# Patient Record
Sex: Female | Born: 1975 | Race: Black or African American | Hispanic: No | Marital: Single | Smoking: Never smoker
Health system: Southern US, Community
[De-identification: ages and names within clinical notes are randomized; demographics above are authoritative.]

## PROBLEM LIST (undated history)

## (undated) DIAGNOSIS — K439 Ventral hernia without obstruction or gangrene: Secondary | ICD-10-CM

## (undated) DIAGNOSIS — R1905 Periumbilic swelling, mass or lump: Secondary | ICD-10-CM

## (undated) HISTORY — PX: TUBAL LIGATION: SHX77

---

## 1998-09-12 ENCOUNTER — Emergency Department (HOSPITAL_COMMUNITY): Admission: EM | Admit: 1998-09-12 | Discharge: 1998-09-12 | Payer: Self-pay | Admitting: Emergency Medicine

## 1999-10-07 ENCOUNTER — Emergency Department (HOSPITAL_COMMUNITY): Admission: EM | Admit: 1999-10-07 | Discharge: 1999-10-07 | Payer: Self-pay | Admitting: Emergency Medicine

## 1999-10-09 ENCOUNTER — Emergency Department (HOSPITAL_COMMUNITY): Admission: EM | Admit: 1999-10-09 | Discharge: 1999-10-09 | Payer: Self-pay | Admitting: Emergency Medicine

## 1999-12-03 ENCOUNTER — Encounter: Payer: Self-pay | Admitting: Emergency Medicine

## 1999-12-03 ENCOUNTER — Emergency Department (HOSPITAL_COMMUNITY): Admission: EM | Admit: 1999-12-03 | Discharge: 1999-12-03 | Payer: Self-pay | Admitting: Emergency Medicine

## 1999-12-20 ENCOUNTER — Emergency Department (HOSPITAL_COMMUNITY): Admission: EM | Admit: 1999-12-20 | Discharge: 1999-12-20 | Payer: Self-pay | Admitting: Emergency Medicine

## 1999-12-29 ENCOUNTER — Encounter: Admission: RE | Admit: 1999-12-29 | Discharge: 1999-12-29 | Payer: Self-pay | Admitting: Internal Medicine

## 2000-02-29 ENCOUNTER — Encounter: Payer: Self-pay | Admitting: Emergency Medicine

## 2000-02-29 ENCOUNTER — Emergency Department (HOSPITAL_COMMUNITY): Admission: EM | Admit: 2000-02-29 | Discharge: 2000-02-29 | Payer: Self-pay | Admitting: Emergency Medicine

## 2000-03-21 ENCOUNTER — Emergency Department (HOSPITAL_COMMUNITY): Admission: EM | Admit: 2000-03-21 | Discharge: 2000-03-21 | Payer: Self-pay | Admitting: Emergency Medicine

## 2000-05-30 ENCOUNTER — Emergency Department (HOSPITAL_COMMUNITY): Admission: EM | Admit: 2000-05-30 | Discharge: 2000-05-30 | Payer: Self-pay | Admitting: *Deleted

## 2000-07-02 ENCOUNTER — Emergency Department (HOSPITAL_COMMUNITY): Admission: EM | Admit: 2000-07-02 | Discharge: 2000-07-02 | Payer: Self-pay | Admitting: Internal Medicine

## 2000-07-02 ENCOUNTER — Encounter: Payer: Self-pay | Admitting: Internal Medicine

## 2000-07-07 ENCOUNTER — Emergency Department (HOSPITAL_COMMUNITY): Admission: EM | Admit: 2000-07-07 | Discharge: 2000-07-07 | Payer: Self-pay | Admitting: Emergency Medicine

## 2000-09-08 ENCOUNTER — Inpatient Hospital Stay (HOSPITAL_COMMUNITY): Admission: EM | Admit: 2000-09-08 | Discharge: 2000-09-12 | Payer: Self-pay | Admitting: Emergency Medicine

## 2009-09-18 ENCOUNTER — Emergency Department (HOSPITAL_COMMUNITY): Admission: EM | Admit: 2009-09-18 | Discharge: 2009-09-18 | Payer: Self-pay | Admitting: Emergency Medicine

## 2009-10-21 ENCOUNTER — Emergency Department (HOSPITAL_COMMUNITY): Admission: EM | Admit: 2009-10-21 | Discharge: 2009-10-21 | Payer: Self-pay | Admitting: Emergency Medicine

## 2009-12-15 ENCOUNTER — Encounter: Admission: RE | Admit: 2009-12-15 | Discharge: 2009-12-15 | Payer: Self-pay | Admitting: Neurology

## 2010-11-07 ENCOUNTER — Encounter: Payer: Self-pay | Admitting: Neurology

## 2010-12-27 ENCOUNTER — Emergency Department (HOSPITAL_COMMUNITY)
Admission: EM | Admit: 2010-12-27 | Discharge: 2010-12-27 | Disposition: A | Discharge status: Home / Self Care | Payer: No Typology Code available for payment source | Attending: Emergency Medicine | Admitting: Emergency Medicine

## 2010-12-27 ENCOUNTER — Emergency Department (HOSPITAL_COMMUNITY): Payer: No Typology Code available for payment source

## 2010-12-27 DIAGNOSIS — R51 Headache: Secondary | ICD-10-CM | POA: Insufficient documentation

## 2010-12-27 DIAGNOSIS — S139XXA Sprain of joints and ligaments of unspecified parts of neck, initial encounter: Secondary | ICD-10-CM | POA: Insufficient documentation

## 2010-12-27 DIAGNOSIS — M542 Cervicalgia: Secondary | ICD-10-CM | POA: Insufficient documentation

## 2011-03-04 NOTE — H&P (Signed)
[place]. [facility]  Patient:    Stacie Stone, Stacie Stone                       MRN: [id number] Adm. Date:  [id number] Disc. Date: [id number] Attending:  Annamarie Dawley                         History and Physical  DATE OF BIRTH:  [birth year]  CHIEF COMPLAINT:  Overdose.  HISTORY OF PRESENT ILLNESS:  Stacie Stone is a 35 year old black female, upset after finding out that her boyfriend had gotten a "minor" pregnant.  She took an overdose of an unknown quantity of Tylenol, questionable one bottle and unknown quantity of aspirin, question one to two bottles of 100 count.  She was [place] after wandering around during the day and was conscious and transferred to the [facility] Emergency Room via EMS.  The patient now states she was not trying to kill herself but her aunt and ER records indicate that it was a suicide attempt.  PAST MEDICAL HISTORY:  Positive for asthma, childbirth x 2.  No surgeries.  ALLERGIES:  NKDA.  MEDICATIONS:  Albuterol MDI p.r.n.  SOCIAL HISTORY:  The patient works, is a single mom of two children, 77 and 57 years old.  No tobacco, alcohol or drugs per history.  FAMILY HISTORY:  Negative for cancer, CAD, hypertension, or diabetes. Positive for asthma.  REVIEW OF SYSTEMS:  No headache or visual changes.  No chest pain or shortness of breath.  No fever.  No dysuria or frequency.  No complaints of abdominal pain or extremity pain.  Positive for nausea.  No BRBPR or hematemesis.  PHYSICAL EXAMINATION:  VITAL SIGNS:  Temperature 97.1, blood pressure 116/78, pulse 56, respirations 18.  GENERAL:  Drowsy.  SKIN:  Warm and dry.  HEENT:  TMs are clear.  EOMI.  PERRL, 4 to 3 mm bilaterally.  No nystagmus. Fundi not visualized.  Oropharynx:  Good dentition.  Does have charcoal staining of her tongue.  NECK:  Supple.  No TMR, adenopathy.  LUNGS:  Clear to auscultation bilaterally.  No wheezes.  No crackles. Good air  movement.  HEART:  RRR, S1, S2.  No MHR.  ABDOMEN:  Positive bowel sounds.  NTND.  No HSM or masses.  GU/RECTAL:  Deferred.  EXTREMITIES:  Pulses 1+.  No CCE.  NEUROLOGICAL:  Oriented x 4.  Cranial nerves II-XII intact, somewhat slurred speech, 5/5 motor.  Sensory is intact bilaterally to fine touch, 3+ biceps and knee DTRs.  No clonus.  Downgoing toes.  LABORATORY DATA:  Silicate level 40.8, acetaminophen level 138.4.  Urine pregnancy test negative.  Urine pH 5.5 with specific gravity 1.028.  CBC: Hemoglobin 12.0, hematocrit 35.9, WBC 6.9, platelets 319.  Sodium 138, potassium 3.2, chloride 102, bicarb 24, BUN 19, creatinine 0.9, glucose 101, calcium 9.9, albumin 3.9.  AST 21, ALT 17, alkaline phosphatase 61.  Total bilirubin 0.8.  Drug screen positive for tetracannabinol.  ECG shows rate of 61, sinus arrhythmia.  ASSESSMENT AND PLAN:  Moderate to severe overdose/suicide attempt with aspirin and acetaminophen.  Alkaline diuresis for aspirin toxicity, Mucomyst for Tylenol.  ICU bed with close monitoring of vital signs.  Electrolytes, drug levels, and serum and urine pH.  I have asked for a psychiatric consult for tomorrow. DD:  09/09/00 TD:  09/09/00 Job: [id number]

## 2011-03-04 NOTE — Discharge Summary (Signed)
[place]. [facility]  Patient:    Stacie Stone, Stacie Stone                       MRN: [id number] Adm. Date:  [id number] Disc. Date: [id number] Attending:  Marily Memos CC:         [facility]   Discharge Summary  DATE OF BIRTH:  [birth year]  ADMISSION DIAGNOSIS:  Tylenol and aspirin overdose/suicide attempt.  DISCHARGE DIAGNOSIS:  Tylenol and aspirin overdose/suicide attempt.  CONSULTATIONS:  Psychiatry.  PROCEDURES:  EKG showing normal sinus rhythm, ventricular rate of 61.  ADMISSION HISTORY:  Ms. Lobue was initially admitted after being upset over a boyfriend getting a minor pregnant.  This was a previous friend of hers, and she when she found out she took Tylenol and aspirin.  At first did not know the quantity.  Later when she was more arousable, she said they were small bottles.  She was found at a shopping center after she had been wandering around for a while.  She was conscious at the time time and was transported to the emergency room via EMS.  HOSPITAL COURSE:  On admission she was quite lethargic.  She was given charcoal.  She also received an alkaline diuresis for the aspirin overdose, and blood gases did normalize.  She received a full course of Mucomyst for the Tylenol overdose.  She was placed in the intensive care unit for close monitoring.  Her vital signs remained stable throughout her hospital course. The morning after overdose she was lucid and conversant and said she was not trying to kill herself.  Prior to discharge she was seen by psychiatry.  They recommended outpatient follow-up and discharge to home.  LABORATORY AND X-RAY DATA:  Drug screen positive for cannabinoids.  Urine initially showed ketones which later resolved.  Initial pH 5.5, with alkalization went up to 8.0.  One urine did show positive LE.  Urine culture was negative.  Initial acetaminophen level 138.4.  Initial salicylate level  of 40.8.  Electrolytes:  Sodium 135, potassium 4.1, chloride 108, bicarb 20, glucose 73, BUN 7, creatinine 0.7, calcium 8.7, protein 6.3, albumin 2.8. LFTs normal.  Magnesium 1.9, phosphorus 3.5.  PT, INR, and PTT normal, and previous electrolytes from November 25.  She initially had a potassium of 3.2 and total protein of 9.3 - all others were within normal limits.  DISCHARGE FOLLOWUP:  The patient is to follow up with [facility] and to call our office with any questions at [phone].  CONDITION ON DISCHARGE:  Much improved. DD:  10/04/00 TD:  10/05/00 Job: [id number]

## 2011-04-10 ENCOUNTER — Emergency Department (HOSPITAL_COMMUNITY)
Admission: EM | Admit: 2011-04-10 | Discharge: 2011-04-10 | Disposition: A | Discharge status: Home / Self Care | Payer: Self-pay | Attending: Emergency Medicine | Admitting: Emergency Medicine

## 2011-04-10 ENCOUNTER — Emergency Department (HOSPITAL_COMMUNITY): Payer: Self-pay

## 2011-04-10 DIAGNOSIS — M79609 Pain in unspecified limb: Secondary | ICD-10-CM | POA: Insufficient documentation

## 2011-04-10 DIAGNOSIS — X500XXA Overexertion from strenuous movement or load, initial encounter: Secondary | ICD-10-CM | POA: Insufficient documentation

## 2011-04-10 DIAGNOSIS — S93609A Unspecified sprain of unspecified foot, initial encounter: Secondary | ICD-10-CM | POA: Insufficient documentation

## 2011-08-17 ENCOUNTER — Emergency Department (HOSPITAL_COMMUNITY)
Admission: EM | Admit: 2011-08-17 | Discharge: 2011-08-17 | Disposition: A | Discharge status: Home / Self Care | Payer: Self-pay | Attending: Emergency Medicine | Admitting: Emergency Medicine

## 2011-08-17 ENCOUNTER — Emergency Department (HOSPITAL_COMMUNITY): Payer: Self-pay

## 2011-08-17 DIAGNOSIS — L299 Pruritus, unspecified: Secondary | ICD-10-CM | POA: Insufficient documentation

## 2011-08-17 DIAGNOSIS — M549 Dorsalgia, unspecified: Secondary | ICD-10-CM | POA: Insufficient documentation

## 2011-08-17 DIAGNOSIS — R109 Unspecified abdominal pain: Secondary | ICD-10-CM | POA: Insufficient documentation

## 2011-08-17 DIAGNOSIS — L989 Disorder of the skin and subcutaneous tissue, unspecified: Secondary | ICD-10-CM | POA: Insufficient documentation

## 2011-08-17 DIAGNOSIS — R21 Rash and other nonspecific skin eruption: Secondary | ICD-10-CM | POA: Insufficient documentation

## 2011-08-17 LAB — COMPREHENSIVE METABOLIC PANEL
ALT: 12 U/L (ref 0–35)
AST: 12 U/L (ref 0–37)
Alkaline Phosphatase: 75 U/L (ref 39–117)
CO2: 25 mEq/L (ref 19–32)
Calcium: 9.6 mg/dL (ref 8.4–10.5)
GFR calc Af Amer: >90 mL/min (ref >90)
GFR calc non Af Amer: >90 mL/min (ref >90)
Glucose, Bld: 74 mg/dL (ref 70–99)
Potassium: 3.2 mEq/L — ABNORMAL LOW (ref 3.5–5.1)
Sodium: 139 mEq/L (ref 135–145)
Total Protein: 7.9 g/dL (ref 6.0–8.3)

## 2011-08-17 LAB — DIFFERENTIAL
Basophils Absolute: 0 10*3/uL (ref 0.0–0.1)
Basophils Relative: 0 % (ref 0–1)
Eosinophils Absolute: 0 10*3/uL (ref 0.0–0.7)
Eosinophils Relative: 0 % (ref 0–5)
Lymphocytes Relative: 29 % (ref 12–46)
Lymphs Abs: 2.3 K/uL (ref 0.7–4.0)
Monocytes Absolute: 0.4 K/uL (ref 0.1–1.0)
Monocytes Relative: 5 % (ref 3–12)
Neutro Abs: 5.3 10*3/uL (ref 1.7–7.7)
Neutrophils Relative %: 66 % (ref 43–77)

## 2011-08-17 LAB — URINALYSIS, ROUTINE W REFLEX MICROSCOPIC
Glucose, UA: NEGATIVE mg/dL
Ketones, ur: 15 mg/dL — AB
Nitrite: NEGATIVE
Protein, ur: NEGATIVE mg/dL
Specific Gravity, Urine: 1.025 (ref 1.005–1.030)
Urobilinogen, UA: 1 mg/dL (ref 0.0–1.0)
pH: 5.5 (ref 5.0–8.0)

## 2011-08-17 LAB — COMPREHENSIVE METABOLIC PANEL WITH GFR
Albumin: 3.6 g/dL (ref 3.5–5.2)
BUN: 10 mg/dL (ref 6–23)
Chloride: 102 meq/L (ref 96–112)
Creatinine, Ser: 0.64 mg/dL (ref 0.50–1.10)
Total Bilirubin: 0.4 mg/dL (ref 0.3–1.2)

## 2011-08-17 LAB — CBC
HCT: 35.7 % — ABNORMAL LOW (ref 36.0–46.0)
Hemoglobin: 11.6 g/dL — ABNORMAL LOW (ref 12.0–15.0)
MCH: 25.6 pg — ABNORMAL LOW (ref 26.0–34.0)
MCHC: 32.5 g/dL (ref 30.0–36.0)
MCV: 78.8 fL (ref 78.0–100.0)
Platelets: 323 10*3/uL (ref 150–400)
RBC: 4.53 MIL/uL (ref 3.87–5.11)
RDW: 15.3 % (ref 11.5–15.5)
WBC: 8 K/uL (ref 4.0–10.5)

## 2011-08-17 LAB — URINE MICROSCOPIC-ADD ON

## 2011-08-17 LAB — LIPASE, BLOOD: Lipase: 20 U/L (ref 11–59)

## 2011-08-17 LAB — PREGNANCY, URINE: Preg Test, Ur: NEGATIVE

## 2011-08-19 LAB — URINE CULTURE
Colony Count: >=100000
Culture  Setup Time: 201210311636

## 2012-12-18 ENCOUNTER — Emergency Department (HOSPITAL_COMMUNITY)
Admission: EM | Admit: 2012-12-18 | Discharge: 2012-12-18 | Disposition: A | Discharge status: Home / Self Care | Payer: Self-pay | Attending: Emergency Medicine | Admitting: Emergency Medicine

## 2012-12-18 ENCOUNTER — Emergency Department (HOSPITAL_COMMUNITY): Payer: Self-pay

## 2012-12-18 ENCOUNTER — Encounter (HOSPITAL_COMMUNITY): Payer: Self-pay | Admitting: *Deleted

## 2012-12-18 DIAGNOSIS — M25539 Pain in unspecified wrist: Secondary | ICD-10-CM | POA: Insufficient documentation

## 2012-12-18 DIAGNOSIS — F17200 Nicotine dependence, unspecified, uncomplicated: Secondary | ICD-10-CM | POA: Insufficient documentation

## 2012-12-18 MED ORDER — IBUPROFEN 200 MG PO TABS
600.0000 mg | ORAL_TABLET | Freq: Once | ORAL | Status: AC
  Administered 2012-12-18: 600 mg via ORAL
  Filled 2012-12-18: qty 3

## 2012-12-18 MED ORDER — IBUPROFEN 600 MG PO TABS: 600.0000 mg | ORAL_TABLET | Freq: Three times a day (TID) | ORAL | Status: DC | PRN

## 2012-12-18 NOTE — ED Notes (Signed)
Pt reports R wrist pain x2 months. Worse with movement. Felt a "pop" a few days ago and it has been worse since then. Pt has a brace on R wrist currently. Denies previous injury.

## 2012-12-18 NOTE — ED Notes (Signed)
Pt alert and oriented x4. Respirations even and unlabored, bilateral symmetrical rise and fall of chest. Skin warm and dry. In no acute distress. Denies needs.   

## 2012-12-18 NOTE — ED Notes (Signed)
Pt to xray

## 2012-12-18 NOTE — ED Notes (Signed)
Pt escorted to discharge window. Pt verbalized understanding discharge instructions. In no acute distress.  

## 2012-12-18 NOTE — ED Notes (Signed)
md at bedside

## 2012-12-18 NOTE — ED Provider Notes (Signed)
History     CSN: [id number]  Arrival date & time 12/18/12  0904   First MD Initiated Contact with Patient 12/18/12 [phone]      Chief Complaint  Patient presents with  . Wrist Pain    (Consider location/radiation/quality/duration/timing/severity/associated sxs/prior treatment) Patient is a 37 y.o. female presenting with wrist pain. The history is provided by the patient.  Wrist Pain  pt c/o right wrist pain for the past 1-2 months. Constant. Dull. Moderate. Worse w palpation and certain movements. Does not recall a specific injury. No redness or skin changes. No numbness/weakness. Pain does not radiate. Current not working. Denies any repetitive use of right hand or wrist. No hx carpal tunnel. Is right hand dominant. No fever/chills.     History reviewed. No pertinent past medical history.  Past Surgical History  Procedure Laterality Date  . Tubal ligation      History reviewed. No pertinent family history.  History  Substance Use Topics  . Smoking status: Current Every Day Smoker  . Smokeless tobacco: Not on file  . Alcohol Use: Yes     Comment: occasionally    OB History   Grav Para Term Preterm Abortions TAB SAB Ect Mult Living                  Review of Systems  Constitutional: Negative for fever.  HENT: Negative for neck pain.   Skin: Negative for rash and wound.  Neurological: Negative for weakness and numbness.    Allergies  Review of patient's allergies indicates no known allergies.  Home Medications  No current outpatient prescriptions on file.  BP 139/77  Pulse 55  Temp(Src) 98.3 F (36.8 C) (Oral)  Resp 14  SpO2 100%  LMP 12/09/2012  Physical Exam  Nursing note and vitals reviewed. Constitutional: She appears well-developed and well-nourished. No distress.  Eyes: Conjunctivae are normal. No scleral icterus.  Neck: Neck supple. No tracheal deviation present.  Cardiovascular: Normal rate.   Pulmonary/Chest: Effort normal. No respiratory  distress.  Abdominal: Normal appearance.  Musculoskeletal: Normal range of motion. She exhibits no edema.  Tenderness right wrist diffusely, no focal scaphoid tenderness. Radial pulse 2+, normal cap refill in digits.   Neurological: She is alert.  R/U/M n fxn intact.   Skin: Skin is warm and dry. No rash noted.  Psychiatric: She has a normal mood and affect.    ED Course  Procedures (including critical care time) Dg Wrist Complete Right  12/18/2012  *RADIOLOGY REPORT*  Clinical Data: Pain.  No known injury.  RIGHT WRIST - COMPLETE 3+ VIEW  Comparison: None.  Findings: No fracture or dislocation.  No significant degenerative changes.  IMPRESSION: Negative plain film exam of the right wrist.   Original Report Authenticated By: Lacy Duverney, M.D.        MDM  Xrays. Motrin po.  Pt has wrist splint with her.  Discussed diff dx incl arthritis/djd, tendonitis, ligament injury/strain, cts.   Will rx splint, motrin. Discussed pcp f/u if symptoms fail to improve/resolve.        Suzi Roots, MD 12/19/12 [phone]

## 2013-01-15 ENCOUNTER — Encounter (HOSPITAL_COMMUNITY): Payer: Self-pay | Admitting: *Deleted

## 2013-01-15 ENCOUNTER — Emergency Department (HOSPITAL_COMMUNITY): Payer: Medicaid Other

## 2013-01-15 ENCOUNTER — Inpatient Hospital Stay (HOSPITAL_COMMUNITY)
Admission: EM | Admit: 2013-01-15 | Discharge: 2013-01-17 | DRG: 153 | Disposition: A | Discharge status: Home / Self Care | Payer: Medicaid Other | Attending: Internal Medicine | Admitting: Internal Medicine

## 2013-01-15 DIAGNOSIS — J039 Acute tonsillitis, unspecified: Principal | ICD-10-CM

## 2013-01-15 DIAGNOSIS — D72829 Elevated white blood cell count, unspecified: Secondary | ICD-10-CM | POA: Diagnosis present

## 2013-01-15 DIAGNOSIS — E876 Hypokalemia: Secondary | ICD-10-CM

## 2013-01-15 DIAGNOSIS — Z23 Encounter for immunization: Secondary | ICD-10-CM

## 2013-01-15 DIAGNOSIS — F17200 Nicotine dependence, unspecified, uncomplicated: Secondary | ICD-10-CM | POA: Diagnosis present

## 2013-01-15 LAB — CBC WITH DIFFERENTIAL/PLATELET
Basophils Absolute: 0 10*3/uL (ref 0.0–0.1)
Basophils Relative: 0 % (ref 0–1)
Eosinophils Absolute: 0 10*3/uL (ref 0.0–0.7)
Hemoglobin: 12.5 g/dL (ref 12.0–15.0)
MCH: 26.3 pg (ref 26.0–34.0)
MCHC: 34.4 g/dL (ref 30.0–36.0)
Neutro Abs: 13.5 10*3/uL — ABNORMAL HIGH (ref 1.7–7.7)
Neutrophils Relative %: 79 % — ABNORMAL HIGH (ref 43–77)
Platelets: 237 10*3/uL (ref 150–400)

## 2013-01-15 LAB — HCG, SERUM, QUALITATIVE: Preg, Serum: NEGATIVE

## 2013-01-15 LAB — POCT I-STAT, CHEM 8
Creatinine, Ser: 0.7 mg/dL (ref 0.50–1.10)
HCT: 39 % (ref 36.0–46.0)
Hemoglobin: 13.3 g/dL (ref 12.0–15.0)
Potassium: 2.8 mEq/L — ABNORMAL LOW (ref 3.5–5.1)
Sodium: 141 mEq/L (ref 135–145)
TCO2: 26 mmol/L (ref 0–100)

## 2013-01-15 LAB — RAPID STREP SCREEN (MED CTR MEBANE ONLY): Streptococcus, Group A Screen (Direct): NEGATIVE

## 2013-01-15 MED ORDER — SODIUM CHLORIDE 0.9 % IV BOLUS (SEPSIS)
1000.0000 mL | Freq: Once | INTRAVENOUS | Status: AC
  Administered 2013-01-15: 1000 mL via INTRAVENOUS

## 2013-01-15 MED ORDER — POTASSIUM CHLORIDE CRYS ER 20 MEQ PO TBCR
40.0000 meq | EXTENDED_RELEASE_TABLET | Freq: Once | ORAL | Status: AC
  Administered 2013-01-15: 40 meq via ORAL
  Filled 2013-01-15: qty 2

## 2013-01-15 MED ORDER — SODIUM CHLORIDE 0.9 % IJ SOLN
3.0000 mL | Freq: Two times a day (BID) | INTRAMUSCULAR | Status: DC
  Administered 2013-01-16 (×2): 3 mL via INTRAVENOUS

## 2013-01-15 MED ORDER — ACETAMINOPHEN 650 MG RE SUPP: 650.0000 mg | Freq: Four times a day (QID) | RECTAL | Status: DC | PRN

## 2013-01-15 MED ORDER — POTASSIUM CHLORIDE IN NACL 20-0.9 MEQ/L-% IV SOLN
INTRAVENOUS | Status: AC
  Administered 2013-01-16 (×3): via INTRAVENOUS
  Filled 2013-01-15 (×4): qty 1000

## 2013-01-15 MED ORDER — MORPHINE SULFATE 4 MG/ML IJ SOLN
4.0000 mg | Freq: Once | INTRAMUSCULAR | Status: AC
  Administered 2013-01-15: 4 mg via INTRAVENOUS
  Filled 2013-01-15: qty 1

## 2013-01-15 MED ORDER — ONDANSETRON HCL 4 MG PO TABS: 4.0000 mg | ORAL_TABLET | Freq: Four times a day (QID) | ORAL | Status: DC | PRN

## 2013-01-15 MED ORDER — ONDANSETRON HCL 4 MG/2ML IJ SOLN: 4.0000 mg | Freq: Four times a day (QID) | INTRAMUSCULAR | Status: DC | PRN

## 2013-01-15 MED ORDER — POTASSIUM CHLORIDE 20 MEQ/15ML (10%) PO LIQD
40.0000 meq | Freq: Once | ORAL | Status: AC
  Administered 2013-01-15: 40 meq via ORAL
  Filled 2013-01-15: qty 30

## 2013-01-15 MED ORDER — CLINDAMYCIN PHOSPHATE 600 MG/50ML IV SOLN
600.0000 mg | Freq: Once | INTRAVENOUS | Status: AC
  Administered 2013-01-15: 600 mg via INTRAVENOUS
  Filled 2013-01-15: qty 50

## 2013-01-15 MED ORDER — IOHEXOL 300 MG/ML  SOLN
75.0000 mL | Freq: Once | INTRAMUSCULAR | Status: AC | PRN
  Administered 2013-01-15: 75 mL via INTRAVENOUS

## 2013-01-15 MED ORDER — ACETAMINOPHEN 325 MG PO TABS: 650.0000 mg | ORAL_TABLET | Freq: Four times a day (QID) | ORAL | Status: DC | PRN

## 2013-01-15 MED ORDER — ONDANSETRON HCL 4 MG/2ML IJ SOLN
4.0000 mg | Freq: Once | INTRAMUSCULAR | Status: AC
  Administered 2013-01-15: 4 mg via INTRAVENOUS

## 2013-01-15 MED ORDER — PREDNISOLONE SODIUM PHOSPHATE 15 MG/5ML PO SOLN
15.0000 mg | Freq: Once | ORAL | Status: AC
  Administered 2013-01-15: 15 mg via ORAL
  Filled 2013-01-15: qty 1

## 2013-01-15 MED ORDER — MORPHINE SULFATE 2 MG/ML IJ SOLN
1.0000 mg | INTRAMUSCULAR | Status: DC | PRN
  Administered 2013-01-16: 1 mg via INTRAVENOUS
  Filled 2013-01-15: qty 1

## 2013-01-15 MED ORDER — CLINDAMYCIN PHOSPHATE 600 MG/50ML IV SOLN
600.0000 mg | Freq: Three times a day (TID) | INTRAVENOUS | Status: DC
  Administered 2013-01-16 – 2013-01-17 (×4): 600 mg via INTRAVENOUS
  Filled 2013-01-15 (×8): qty 50

## 2013-01-15 NOTE — ED Notes (Signed)
Pt placed on monitor, continuous pulse oximetry and blood pressure cuff; family at bedside 

## 2013-01-15 NOTE — ED Provider Notes (Signed)
History     CSN: [id number]  Arrival date & time 01/15/13  1517   First MD Initiated Contact with Patient 01/15/13 1639      Chief Complaint  Patient presents with  . URI  . Sore Throat    (Consider location/radiation/quality/duration/timing/severity/associated sxs/prior treatment) HPI Comments: This is a 37 year old female, in no pertinent past medical history, who presents emergency department with chief complaint of sore throat, and generalized body aches x3 days. She states that she is in 10 out of 10 pain. Symptoms are worse with swallowing and talking. She states that she is tried taking Tylenol with minimal relief. She also endorses sharp, intermittent chest pain, which is worsened with deep breathing. She endorses sore throat, chest pain, headache, nausea, and vomiting.  She denies cough, SOB, and diarrhea.  Her friend who accompanies her, states that she is not talking normal.  She has not had any drooling.    The history is provided by the patient. No language interpreter was used.    History reviewed. No pertinent past medical history.  Past Surgical History  Procedure Laterality Date  . Tubal ligation      No family history on file.  History  Substance Use Topics  . Smoking status: Current Every Day Smoker  . Smokeless tobacco: Not on file  . Alcohol Use: Yes     Comment: occasionally    OB History   Grav Para Term Preterm Abortions TAB SAB Ect Mult Living                  Review of Systems  All other systems reviewed and are negative.    Allergies  Review of patient's allergies indicates no known allergies.  Home Medications   Current Outpatient Rx  Name  Route  Sig  Dispense  Refill  . acetaminophen (TYLENOL) 500 MG tablet   Oral   Take 1,000 mg by mouth every 6 (six) hours as needed for pain.           BP 149/100  Pulse 101  Temp(Src) 99.5 F (37.5 C) (Oral)  Resp 20  SpO2 99%  LMP 01/03/2013  Physical Exam  Nursing note and  vitals reviewed. Constitutional: She is oriented to person, place, and time. She appears well-developed and well-nourished.  HENT:  Head: Normocephalic and atraumatic.  Bilateral swollen tonsils, with bilateral exudate, uvula is midline, airway is intact, no sign of Ludwig's angina  Eyes: Conjunctivae and EOM are normal.  Neck: Normal range of motion. Neck supple.  Cardiovascular: Normal rate, regular rhythm and normal heart sounds.  Exam reveals no gallop and no friction rub.   No murmur heard. Pulmonary/Chest: Effort normal and breath sounds normal. No respiratory distress. She has no wheezes. She has no rales. She exhibits no tenderness.  Abdominal: Soft. Bowel sounds are normal. She exhibits no distension and no mass. There is no tenderness. There is no rebound and no guarding.  Musculoskeletal: Normal range of motion. She exhibits no edema and no tenderness.  Lymphadenopathy:    She has cervical adenopathy.  Neurological: She is alert and oriented to person, place, and time.  Skin: Skin is warm and dry.  Psychiatric: She has a normal mood and affect. Her behavior is normal. Judgment and thought content normal.    ED Course  Procedures (including critical care time)  Labs Reviewed  CBC WITH DIFFERENTIAL - Abnormal; Notable for the following:    WBC 17.2 (*)    MCV 76.3 (*)  RDW 15.7 (*)    Neutrophils Relative 79 (*)    Neutro Abs 13.5 (*)    Monocytes Absolute 1.1 (*)    All other components within normal limits  POCT I-STAT, CHEM 8 - Abnormal; Notable for the following:    Potassium 2.8 (*)    Glucose, Bld 103 (*)    All other components within normal limits  RAPID STREP SCREEN  HCG, SERUM, QUALITATIVE   Results for orders placed during the hospital encounter of 01/15/13  RAPID STREP SCREEN      Result Value Range   Streptococcus, Group A Screen (Direct) NEGATIVE  NEGATIVE  CBC WITH DIFFERENTIAL      Result Value Range   WBC 17.2 (*) 4.0 - 10.5 K/uL   RBC 4.76   3.87 - 5.11 MIL/uL   Hemoglobin 12.5  12.0 - 15.0 g/dL   HCT 98.1  19.1 - 47.8 %   MCV 76.3 (*) 78.0 - 100.0 fL   MCH 26.3  26.0 - 34.0 pg   MCHC 34.4  30.0 - 36.0 g/dL   RDW 29.5 (*) 62.1 - 30.8 %   Platelets 237  150 - 400 K/uL   Neutrophils Relative 79 (*) 43 - 77 %   Neutro Abs 13.5 (*) 1.7 - 7.7 K/uL   Lymphocytes Relative 15  12 - 46 %   Lymphs Abs 2.6  0.7 - 4.0 K/uL   Monocytes Relative 6  3 - 12 %   Monocytes Absolute 1.1 (*) 0.1 - 1.0 K/uL   Eosinophils Relative 0  0 - 5 %   Eosinophils Absolute 0.0  0.0 - 0.7 K/uL   Basophils Relative 0  0 - 1 %   Basophils Absolute 0.0  0.0 - 0.1 K/uL  HCG, SERUM, QUALITATIVE      Result Value Range   Preg, Serum NEGATIVE  NEGATIVE  POCT I-STAT, CHEM 8      Result Value Range   Sodium 141  135 - 145 mEq/L   Potassium 2.8 (*) 3.5 - 5.1 mEq/L   Chloride 105  96 - 112 mEq/L   BUN 8  6 - 23 mg/dL   Creatinine, Ser 6.57  0.50 - 1.10 mg/dL   Glucose, Bld 846 (*) 70 - 99 mg/dL   Calcium, Ion 9.62  9.52 - 1.23 mmol/L   TCO2 26  0 - 100 mmol/L   Hemoglobin 13.3  12.0 - 15.0 g/dL   HCT 84.1  32.4 - 40.1 %   Dg Chest 2 View  01/15/2013  *RADIOLOGY REPORT*  Clinical Data: Sore throat, URI symptoms  CHEST - 2 VIEW  Comparison: None.  Findings: Lungs are essentially clear.  No focal consolidation. No pleural effusion or pneumothorax.  Cardiomediastinal silhouette is within normal limits.  Visualized osseous structures are within normal limits.  IMPRESSION: No evidence of acute cardiopulmonary disease.   Original Report Authenticated By: Charline Bills, M.D.    Dg Wrist Complete Right  12/18/2012  *RADIOLOGY REPORT*  Clinical Data: Pain.  No known injury.  RIGHT WRIST - COMPLETE 3+ VIEW  Comparison: None.  Findings: No fracture or dislocation.  No significant degenerative changes.  IMPRESSION: Negative plain film exam of the right wrist.   Original Report Authenticated By: Lacy Duverney, M.D.    Ct Soft Tissue Neck W Contrast  01/15/2013   *RADIOLOGY REPORT*  Clinical Data: Sore throat  CT NECK WITH CONTRAST  Technique:  Multidetector CT imaging of the neck was performed with intravenous contrast.  Contrast: 75mL OMNIPAQUE IOHEXOL 300 MG/ML  SOLN  Comparison: None.  Findings: Adenoidal tonsillar tissue is mildly prominent. Nasopharynx is widely patent.  Palatine tonsils are severely prominent resulting in narrowing of the oral pharyngeal airway. There is no peritonsillar abscess.  Lingual tonsillar tissue is unremarkable.  No evidence of abnormal adenopathy by measurement criteria. Jugular veins are patent bilaterally.  Carotid arteries are also grossly patent.  Floor of the mouth is unremarkable.  Submandibular glands are within normal limits.  Lung apices are clear.  Mastoid air cells are clear.  Small mucous retention cyst in the posterior right maxillary sinus.  Mucous retention cysts are present in the left sphenoid sinus.  Normal thyroid gland.  Mild degenerative changes in the cervical spine.  IMPRESSION: Palatine tonsils are markedly prominent compatible with an inflammatory process.  No evidence of a peritonsillar abscess.  Mild prominence of the adenoidal lymphoid tissue.   Original Report Authenticated By: Jolaine Click, M.D.      ED ECG REPORT  I personally interpreted this EKG   Date: 01/15/2013   Rate: 82  Rhythm: normal sinus rhythm  QRS Axis: normal  Intervals: normal  ST/T Wave abnormalities: normal  Conduction Disutrbances:none  Narrative Interpretation:   Old EKG Reviewed: none available    1. Tonsillitis   2. Hypokalemia       MDM  37 year old female with sore throat and chest pain. Sore throat concerning for tonsillitis, possibly with abscess, will order CT soft tissue neck to rule out abscess. Check basic labs, give fluids. Will order chest x-ray and EKG to further evaluate chest pain.  Patient has never had a DVT/PE, no recent long travel, doubt PE.   6:28 PM Patient has been seen by and discussed with  Dr. Manus Gunning. Patient will need to be admitted for obs to ensure that her airway remains intact.      Roxy Horseman, PA-C 01/16/13 [phone]

## 2013-01-15 NOTE — ED Notes (Signed)
Pt states sore throat, headache, intermittent chest pain and rhinitis began Sunday. Unable to control pain with tylenol (last took 1000 mg acet at 1500).  Tonsils swollen, red with white exudate.

## 2013-01-15 NOTE — ED Notes (Signed)
Gave pt water for fluid challenge. Pt took a sip and stated it hurts to swallow and she can't drink it. Then pt hawked up phlegm and spit.

## 2013-01-15 NOTE — ED Notes (Signed)
Attempted to call report. Floor RN unable to accept report.  

## 2013-01-15 NOTE — ED Notes (Signed)
Patient transported to CT 

## 2013-01-15 NOTE — ED Notes (Signed)
Family at bedside. 

## 2013-01-16 LAB — CBC WITH DIFFERENTIAL/PLATELET
Basophils Absolute: 0 K/uL (ref 0.0–0.1)
Basophils Relative: 0 % (ref 0–1)
Eosinophils Absolute: 0 K/uL (ref 0.0–0.7)
Eosinophils Relative: 0 % (ref 0–5)
HCT: 32 % — ABNORMAL LOW (ref 36.0–46.0)
Hemoglobin: 10.8 g/dL — ABNORMAL LOW (ref 12.0–15.0)
Lymphocytes Relative: 14 % (ref 12–46)
Lymphs Abs: 1.7 K/uL (ref 0.7–4.0)
MCH: 26 pg (ref 26.0–34.0)
MCHC: 33.8 g/dL (ref 30.0–36.0)
MCV: 76.9 fL — ABNORMAL LOW (ref 78.0–100.0)
Monocytes Absolute: 0.7 K/uL (ref 0.1–1.0)
Monocytes Relative: 5 % (ref 3–12)
Neutro Abs: 10.3 K/uL — ABNORMAL HIGH (ref 1.7–7.7)
Neutrophils Relative %: 81 % — ABNORMAL HIGH (ref 43–77)
Platelets: 219 K/uL (ref 150–400)
RBC: 4.16 MIL/uL (ref 3.87–5.11)
RDW: 16.1 % — ABNORMAL HIGH (ref 11.5–15.5)
WBC: 12.7 K/uL — ABNORMAL HIGH (ref 4.0–10.5)

## 2013-01-16 LAB — COMPREHENSIVE METABOLIC PANEL WITH GFR
ALT: 16 U/L (ref 0–35)
AST: 15 U/L (ref 0–37)
Albumin: 3.1 g/dL — ABNORMAL LOW (ref 3.5–5.2)
Alkaline Phosphatase: 65 U/L (ref 39–117)
BUN: 7 mg/dL (ref 6–23)
CO2: 23 meq/L (ref 19–32)
Calcium: 8.6 mg/dL (ref 8.4–10.5)
Chloride: 107 meq/L (ref 96–112)
Creatinine, Ser: 0.57 mg/dL (ref 0.50–1.10)
GFR calc Af Amer: >90 mL/min
GFR calc non Af Amer: >90 mL/min
Glucose, Bld: 100 mg/dL — ABNORMAL HIGH (ref 70–99)
Potassium: 4 meq/L (ref 3.5–5.1)
Sodium: 141 meq/L (ref 135–145)
Total Bilirubin: 0.2 mg/dL — ABNORMAL LOW (ref 0.3–1.2)
Total Protein: 7.1 g/dL (ref 6.0–8.3)

## 2013-01-16 LAB — MAGNESIUM: Magnesium: 2.3 mg/dL (ref 1.5–2.5)

## 2013-01-16 MED ORDER — INFLUENZA VIRUS VACC SPLIT PF IM SUSP
0.5000 mL | INTRAMUSCULAR | Status: AC
  Administered 2013-01-17: 0.5 mL via INTRAMUSCULAR
  Filled 2013-01-16: qty 0.5

## 2013-01-16 MED ORDER — HYDROCODONE-ACETAMINOPHEN 7.5-325 MG/15ML PO SOLN
10.0000 mL | Freq: Four times a day (QID) | ORAL | Status: DC | PRN
  Administered 2013-01-16: 10 mL via ORAL
  Filled 2013-01-16: qty 15

## 2013-01-16 MED ORDER — BOOST / RESOURCE BREEZE PO LIQD
1.0000 | Freq: Three times a day (TID) | ORAL | Status: DC
  Administered 2013-01-16 – 2013-01-17 (×4): 1 via ORAL

## 2013-01-16 NOTE — Progress Notes (Addendum)
[facility] PROGRESS NOTE  Stacie Stone ZOX:[id number] DOB: 1975/10/25 DOA: 01/15/2013 PCP: No PCP Per Patient  Assessment/Plan: Acute exudative tonsillitis -continue with IV antibiotics.  -wbc down to 12.7 and afebrile with decreased pain and swelling -improving clinically, will advance to full liquids -if continue to improve will possibly dc/ in am on oral abx with outpt follow up  Hypokalemia -resolved  Code Status: full Family Communication: directly with pt at bedside Disposition Plan: to home when stable   Consultants:  none  Procedures:  none  Antibiotics:  Clindamycin - started on 4/1  HPI/Subjective: States decreased throat pain and swelling, tolerating clears  Objective: Filed Vitals:   01/15/13 2100 01/15/13 2155 01/16/13 0141 01/16/13 0537  BP: 131/99 140/93 138/84 139/83  Pulse: 82 92 88 83  Temp: 98.6 F (37 C) 99.4 F (37.4 C) 98.6 F (37 C) 98.2 F (36.8 C)  TempSrc: Oral Oral Oral Oral  Resp:  18 18 18   Height:  5\' 7"  (1.702 m)    Weight:  72.3 kg (159 lb 6.3 oz)  72.5 kg (159 lb 13.3 oz)  SpO2: 98% 100% 99% 100%    Intake/Output Summary (Last 24 hours) at 01/16/13 1330 Last data filed at 01/16/13 0009  Gross per 24 hour  Intake      3 ml  Output      0 ml  Net      3 ml   Filed Weights   01/15/13 2155 01/16/13 0537  Weight: 72.3 kg (159 lb 6.3 oz) 72.5 kg (159 lb 13.3 oz)    Exam:   General:  Alert and oriented x3,in NAD  HEENT: enlarged tonsils,erythematous, minimal exudate  Cardiovascular: RRR, nl S1S2  Respiratory: CTAB  Abdomen: soft, +BS NT/ND  Extremities: No cyanosis and no edema   Data Reviewed: Basic Metabolic Panel:  Recent Labs Lab 01/15/13 1653 01/16/13 0500  NA 141 141  K 2.8* 4.0  CL 105 107  CO2  --  23  GLUCOSE 103* 100*  BUN 8 7  CREATININE 0.70 0.57  CALCIUM  --  8.6  MG  --  2.3   Liver Function Tests:  Recent Labs Lab 01/16/13 0500  AST 15  ALT 16  ALKPHOS 65  BILITOT  0.2*  PROT 7.1  ALBUMIN 3.1*   No results found for this basename: LIPASE, AMYLASE,  in the last 168 hours No results found for this basename: AMMONIA,  in the last 168 hours CBC:  Recent Labs Lab 01/15/13 1528 01/15/13 1653 01/16/13 0500  WBC 17.2*  --  12.7*  NEUTROABS 13.5*  --  10.3*  HGB 12.5 13.3 10.8*  HCT 36.3 39.0 32.0*  MCV 76.3*  --  76.9*  PLT 237  --  219   Cardiac Enzymes: No results found for this basename: CKTOTAL, CKMB, CKMBINDEX, TROPONINI,  in the last 168 hours BNP (last 3 results) No results found for this basename: PROBNP,  in the last 8760 hours CBG: No results found for this basename: GLUCAP,  in the last 168 hours  Recent Results (from the past 240 hour(s))  RAPID STREP SCREEN     Status: None   Collection Time    01/15/13  3:31 PM      Result Value Range Status   Streptococcus, Group A Screen (Direct) NEGATIVE  NEGATIVE Final   Comment:            DUE TO INADEQUATE SENSITIVITY OF EIA     RAPID TESTS FOR  GROUP A STREP (GAS)     IT IS RECOMMENDED THAT ALL NEGATIVE     RESULTS BE FOLLOWED BY A     GROUP A STREP PROBE.     Studies: Dg Chest 2 View  01/15/2013  *RADIOLOGY REPORT*  Clinical Data: Sore throat, URI symptoms  CHEST - 2 VIEW  Comparison: None.  Findings: Lungs are essentially clear.  No focal consolidation. No pleural effusion or pneumothorax.  Cardiomediastinal silhouette is within normal limits.  Visualized osseous structures are within normal limits.  IMPRESSION: No evidence of acute cardiopulmonary disease.   Original Report Authenticated By: Charline Bills, M.D.    Ct Soft Tissue Neck W Contrast  01/15/2013  *RADIOLOGY REPORT*  Clinical Data: Sore throat  CT NECK WITH CONTRAST  Technique:  Multidetector CT imaging of the neck was performed with intravenous contrast.  Contrast: 75mL OMNIPAQUE IOHEXOL 300 MG/ML  SOLN  Comparison: None.  Findings: Adenoidal tonsillar tissue is mildly prominent. Nasopharynx is widely patent.  Palatine  tonsils are severely prominent resulting in narrowing of the oral pharyngeal airway. There is no peritonsillar abscess.  Lingual tonsillar tissue is unremarkable.  No evidence of abnormal adenopathy by measurement criteria. Jugular veins are patent bilaterally.  Carotid arteries are also grossly patent.  Floor of the mouth is unremarkable.  Submandibular glands are within normal limits.  Lung apices are clear.  Mastoid air cells are clear.  Small mucous retention cyst in the posterior right maxillary sinus.  Mucous retention cysts are present in the left sphenoid sinus.  Normal thyroid gland.  Mild degenerative changes in the cervical spine.  IMPRESSION: Palatine tonsils are markedly prominent compatible with an inflammatory process.  No evidence of a peritonsillar abscess.  Mild prominence of the adenoidal lymphoid tissue.   Original Report Authenticated By: Jolaine Click, M.D.     Scheduled Meds: . clindamycin (CLEOCIN) IV  600 mg Intravenous Q8H  . feeding supplement  1 Container Oral TID BM  . sodium chloride  3 mL Intravenous Q12H   Continuous Infusions: . 0.9 % NaCl with KCl 20 mEq / L 125 mL/hr at 01/16/13 4098    Principal Problem:   Tonsillitis Active Problems:   Hypokalemia    Time spent:    [name]  [facility] Pager [phone] If 7PM-7AM, please contact night-coverage at [email or web address], password [facility] 01/16/2013, 1:30 PM  LOS: 1 day

## 2013-01-16 NOTE — Progress Notes (Signed)
Utilization Review Completed.   Dima Ferrufino, RN, BSN Nurse Case Manager  [phone]  

## 2013-01-16 NOTE — ED Provider Notes (Signed)
Medical screening examination/treatment/procedure(s) were conducted as a shared visit with non-physician practitioner(s) and myself.  I personally evaluated the patient during the encounter  Sore throat, fever, odynophagia x 3 days.  Hot potato voice, symmetrical edematous tonsils with exudates. Uvula midline. No trismus, floor of mouth soft. Controlling secretions.  Glynn Octave, MD 01/16/13 [phone]

## 2013-01-16 NOTE — H&P (Signed)
[facility] History and Physical  Stacie Stone ZOX:[id number] DOB: 01-Feb-1976 DOA: 01/15/2013  Referring physician: ER physician.  PCP: No PCP Per Patient  Specialists: None.  Chief Complaint: Fever and throat pain.  HPI: Stacie Stone is a 37 y.o. female no significant past medical history has been experiencing throat pain and swelling with fever chills since Sunday 3 days ago. Patient also started experiencing difficulty swallowing and her symptoms gradually got worse and and decided to come to the ER. On exam in the ER patient was found to have inflamed tonsils with exudates and CT of the neck was done which shows enlarged tonsils with no abscess. At this time patient has been admitted for IV antibiotics. Denies any chest pain or shortness of breath. Patient is able to swallow and there is no drooling of saliva. Though she does have mild pain on swallowing. Denies any difficulty breathing.  Review of Systems: As presented in the history of presenting illness, rest negative.  History reviewed. No pertinent past medical history. Past Surgical History  Procedure Laterality Date  . Tubal ligation     Social History:  reports that she has been smoking.  She does not have any smokeless tobacco history on file. She reports that  drinks alcohol. She reports that she uses illicit drugs (Marijuana). Lives at home. where does patient live-- Can do ADLs. Can patient participate in ADLs?  No Known Allergies  Family History  Problem Relation Age of Onset  . Hypertension Mother   . Ovarian cancer Sister   . Hypertension Sister       Prior to Admission medications   Medication Sig Start Date End Date Taking? Authorizing Provider  acetaminophen (TYLENOL) 500 MG tablet Take 1,000 mg by mouth every 6 (six) hours as needed for pain.   Yes Historical Provider, MD   Physical Exam: Filed Vitals:   01/15/13 1830 01/15/13 1900 01/15/13 2100 01/15/13 2155  BP: 121/74 121/84 131/99 140/93   Pulse: 84 82 82 92  Temp:   98.6 F (37 C) 99.4 F (37.4 C)  TempSrc:   Oral Oral  Resp:    18  Height:    5\' 7"  (1.702 m)  Weight:    72.3 kg (159 lb 6.3 oz)  SpO2: 98% 99% 98% 100%     General:  Well developed and nourished. Not in acute distress.  Eyes: Anicteric no pallor.  ENT: Enlarged tonsils with exudates. Mildly tender jugular digastric nodes. No discharge from the nose or ears.  Neck: As explained in the ENT.  Cardiovascular: S1-S2 heard.  Respiratory: No rhonchi or crepitations.  Abdomen: Soft nontender bowel sounds present.  Skin: No rash.  Musculoskeletal: No edema.  Psychiatric: Appears normal.  Neurologic: Alert awake oriented to time place and person. Moves all extremities.  Labs on Admission:  Basic Metabolic Panel:  Recent Labs Lab 01/15/13 1653  NA 141  K 2.8*  CL 105  GLUCOSE 103*  BUN 8  CREATININE 0.70   Liver Function Tests: No results found for this basename: AST, ALT, ALKPHOS, BILITOT, PROT, ALBUMIN,  in the last 168 hours No results found for this basename: LIPASE, AMYLASE,  in the last 168 hours No results found for this basename: AMMONIA,  in the last 168 hours CBC:  Recent Labs Lab 01/15/13 1528 01/15/13 1653  WBC 17.2*  --   NEUTROABS 13.5*  --   HGB 12.5 13.3  HCT 36.3 39.0  MCV 76.3*  --   PLT 237  --  Cardiac Enzymes: No results found for this basename: CKTOTAL, CKMB, CKMBINDEX, TROPONINI,  in the last 168 hours  BNP (last 3 results) No results found for this basename: PROBNP,  in the last 8760 hours CBG: No results found for this basename: GLUCAP,  in the last 168 hours  Radiological Exams on Admission: Dg Chest 2 View  01/15/2013  *RADIOLOGY REPORT*  Clinical Data: Sore throat, URI symptoms  CHEST - 2 VIEW  Comparison: None.  Findings: Lungs are essentially clear.  No focal consolidation. No pleural effusion or pneumothorax.  Cardiomediastinal silhouette is within normal limits.  Visualized osseous  structures are within normal limits.  IMPRESSION: No evidence of acute cardiopulmonary disease.   Original Report Authenticated By: Charline Bills, M.D.    Ct Soft Tissue Neck W Contrast  01/15/2013  *RADIOLOGY REPORT*  Clinical Data: Sore throat  CT NECK WITH CONTRAST  Technique:  Multidetector CT imaging of the neck was performed with intravenous contrast.  Contrast: 75mL OMNIPAQUE IOHEXOL 300 MG/ML  SOLN  Comparison: None.  Findings: Adenoidal tonsillar tissue is mildly prominent. Nasopharynx is widely patent.  Palatine tonsils are severely prominent resulting in narrowing of the oral pharyngeal airway. There is no peritonsillar abscess.  Lingual tonsillar tissue is unremarkable.  No evidence of abnormal adenopathy by measurement criteria. Jugular veins are patent bilaterally.  Carotid arteries are also grossly patent.  Floor of the mouth is unremarkable.  Submandibular glands are within normal limits.  Lung apices are clear.  Mastoid air cells are clear.  Small mucous retention cyst in the posterior right maxillary sinus.  Mucous retention cysts are present in the left sphenoid sinus.  Normal thyroid gland.  Mild degenerative changes in the cervical spine.  IMPRESSION: Palatine tonsils are markedly prominent compatible with an inflammatory process.  No evidence of a peritonsillar abscess.  Mild prominence of the adenoidal lymphoid tissue.   Original Report Authenticated By: Jolaine Click, M.D.      Assessment/Plan Principal Problem:   Tonsillitis Active Problems:   Hypokalemia   1. Acute exudative tonsillitis - continue with IV antibiotics for now. Closely observe for any worsening. I have placed patient on clear liquid diet. Patient also got one dose of steroids in the ER. If symptoms are improved advance diet.    Code Status: Full code.  Family Communication: None.  Disposition Plan: Admit to inpatient.    Afton Mikelson N. [facility] Pager [phone].  If 7PM-7AM, please  contact night-coverage [email or web address] Password [email or web address] 01/16/2013, 12:12 AM

## 2013-01-16 NOTE — Progress Notes (Signed)
INITIAL NUTRITION ASSESSMENT  DOCUMENTATION CODES Per approved criteria  -Not Applicable   INTERVENTION: 1. [employer]reeze po TID, each supplement provides 250 kcal and 9 grams of protein.  2. RD will continue to follow    NUTRITION DIAGNOSIS: Inadequate oral intake  related to sore throat as evidenced by reported no intake x3 days .   Goal: PO intake to meet >/=90% estimated nutrition needs.   Monitor:  PO intake, weight trends, labs, I/O's  Reason for Assessment: Consult- poor po intake and Malnutrition Screening Tool   37 y.o. female  Admitting Dx: Tonsillitis  ASSESSMENT: Pt admitted with tonsilitis, sore throat, difficulty swallowing, and fever.   Pt states she has not been able to drink anything for several days PTA. Reports about 5 lb weight loss in the past week. Expect that much of this weight loss is fluid related. Pt is agreeable to Resource [employer]reeze supplements.   Height: Ht Readings from Last 1 Encounters:  01/15/13 5\' 7"  (1.702 m)    Weight: Wt Readings from Last 1 Encounters:  01/16/13 159 lb 13.3 oz (72.5 kg)    Ideal Body Weight: 135 lbs   % Ideal Body Weight: 118%  Wt Readings from Last 10 Encounters:  01/16/13 159 lb 13.3 oz (72.5 kg)    Usual Body Weight: 163 lbs   % Usual Body Weight: 98%  BMI:  Body mass index is 25.03 kg/(m^2). WNL   Estimated Nutritional Needs: Kcal: 1800-2000 Protein: 60-70 gm  Fluid: 1.8- 2 L   Skin: intact  Diet Order: Clear Liquid  EDUCATION NEEDS: -No education needs identified at this time   Intake/Output Summary (Last 24 hours) at 01/16/13 1006 Last data filed at 01/16/13 0009  Gross per 24 hour  Intake      3 ml  Output      0 ml  Net      3 ml    Last BM: PTA   Labs:   Recent Labs Lab 01/15/13 1653 01/16/13 0500  NA 141 141  K 2.8* 4.0  CL 105 107  CO2  --  23  BUN 8 7  CREATININE 0.70 0.57  CALCIUM  --  8.6  MG  --  2.3  GLUCOSE 103* 100*    CBG (last 3)  No results found  for this basename: GLUCAP,  in the last 72 hours  Scheduled Meds: . clindamycin (CLEOCIN) IV  600 mg Intravenous Q8H  . sodium chloride  3 mL Intravenous Q12H    Continuous Infusions: . 0.9 % NaCl with KCl 20 mEq / L 125 mL/hr at 01/16/13 1610    History reviewed. No pertinent past medical history.  Past Surgical History  Procedure Laterality Date  . Tubal ligation      Clarene Duke RD, LDN Pager [phone] After Hours pager [phone]

## 2013-01-17 LAB — BASIC METABOLIC PANEL
BUN: 4 mg/dL — ABNORMAL LOW (ref 6–23)
Chloride: 106 mEq/L (ref 96–112)
GFR calc Af Amer: >90 mL/min (ref >90)
Potassium: 3.3 mEq/L — ABNORMAL LOW (ref 3.5–5.1)
Sodium: 140 mEq/L (ref 135–145)

## 2013-01-17 LAB — CBC
HCT: 30.1 % — ABNORMAL LOW (ref 36.0–46.0)
Hemoglobin: 10.3 g/dL — ABNORMAL LOW (ref 12.0–15.0)
RDW: 16.2 % — ABNORMAL HIGH (ref 11.5–15.5)
WBC: 8 10*3/uL (ref 4.0–10.5)

## 2013-01-17 MED ORDER — HYDROCODONE-ACETAMINOPHEN 7.5-325 MG/15ML PO SOLN: 10.0000 mL | Freq: Four times a day (QID) | ORAL | Status: DC | PRN

## 2013-01-17 MED ORDER — SACCHAROMYCES BOULARDII 250 MG PO CAPS: 250.0000 mg | ORAL_CAPSULE | Freq: Two times a day (BID) | ORAL | Status: DC

## 2013-01-17 MED ORDER — CLINDAMYCIN HCL 300 MG PO CAPS: 300.0000 mg | ORAL_CAPSULE | Freq: Three times a day (TID) | ORAL | Status: DC

## 2013-01-17 MED ORDER — POTASSIUM CHLORIDE CRYS ER 20 MEQ PO TBCR
60.0000 meq | EXTENDED_RELEASE_TABLET | Freq: Once | ORAL | Status: AC
  Administered 2013-01-17: 60 meq via ORAL
  Filled 2013-01-17: qty 3

## 2013-01-17 NOTE — Progress Notes (Signed)
Patient's IV and telemetry has been discontinued; patient verbalizes understanding of discharge instructions and will be discharged home. D. Iban Utz RN 

## 2013-01-17 NOTE — Discharge Summary (Signed)
Physician Discharge Summary  JALEYA PEBLEY ZOX:[id number] DOB: 05/27/1976 DOA: 01/15/2013  PCP: No PCP Per Patient  Admit date: 01/15/2013 Discharge date: 01/17/2013  Time spent: >30 minutes  Recommendations for Outpatient Follow-up:      Follow-up Information   Please follow up. (PCP  in 1-2weeks, call for appt upon discharge)       Follow up with BATES, DWIGHT, MD. (call for appt upon discharge)    Contact information:   [address] [place] [address] [phone]       Discharge Diagnoses:  Principal Problem:   Tonsillitis Active Problems:   Hypokalemia   Discharge Condition: improved/stable  Diet recommendation: Regular  Filed Weights   01/15/13 2155 01/16/13 0537 01/17/13 0630  Weight: 72.3 kg (159 lb 6.3 oz) 72.5 kg (159 lb 13.3 oz) 73.664 kg (162 lb 6.4 oz)    History of present illness:  Stacie Stone is a 37 y.o. female no significant past medical history has been experiencing throat pain and swelling with fever chills since Sunday 3 days ago. Patient also started experiencing difficulty swallowing and her symptoms gradually got worse and and decided to come to the ER. On exam in the ER patient was found to have inflamed tonsils with exudates and CT of the neck was done which shows enlarged tonsils with no abscess. At this time patient has been admitted for IV antibiotics. Denies any chest pain or shortness of breath. Patient is able to swallow and there is no drooling of saliva. Though she does have mild pain on swallowing. Denies any difficulty breathing   Hospital Course:  Acute exudative tonsillitis  -As discussed above upon admission rapid stress test was done and patient was started on empiric antibiotics with IV clindamycin -The rapid strep screen came back negative -Patient responded well to above antibiotics and her symptoms improved clinically and her diet was advanced she is tolerating solids at this time. -Leukocytosis improved from 17.2  on admission to eat today prior to discharge and she's remained afebrile and hemodynamically stable. -Patient reported that she previously had a tonsillitis and was concerned about this recurring, I discussed patient with Dr. Jenne Pane ENT and he agrees to have patient follow up as him at the office.  Hypokalemia  -resolved, potassium was replaced in the hospital.  -The case manager is to provide assistance to patient for PCP for followup upon discharge. Procedures:  none  Consultations:  none  Discharge Exam: Filed Vitals:   01/16/13 1015 01/16/13 1340 01/16/13 2122 01/17/13 0630  BP: 122/70 115/65 128/66 115/73  Pulse: 70 63 53 79  Temp: 98.4 F (36.9 C) 98.3 F (36.8 C) 98.2 F (36.8 C) 98.7 F (37.1 C)  TempSrc: Oral Oral Oral Oral  Resp:  20 18 18   Height:      Weight:    73.664 kg (162 lb 6.4 oz)  SpO2: 100% 100% 100% 100%   Exam:  General: Alert and oriented x3,in NAD  HEENT: Decreased tonsillar swelling and erythema, no exudates  Cardiovascular: RRR, nl S1S2  Respiratory: CTAB  Abdomen: soft, +BS NT/ND  Extremities: No cyanosis and no edema      Discharge Instructions  Discharge Orders   Future Orders Complete By Expires     Diet - low sodium heart healthy  As directed     Diet general  As directed     Increase activity slowly  As directed         Medication List  TAKE these medications       acetaminophen 500 MG tablet  Commonly known as:  TYLENOL  Take 1,000 mg by mouth every 6 (six) hours as needed for pain.     clindamycin 300 MG capsule  Commonly known as:  CLEOCIN  Take 1 capsule (300 mg total) by mouth 3 (three) times daily.     HYDROcodone-acetaminophen 7.5-325 mg/15 ml solution  Commonly known as:  HYCET  Take 10 mLs by mouth every 6 (six) hours as needed for pain.      Florastor 250mg  1tab 2 times daily     Follow-up Information   Please follow up. (PCP  in 1-2weeks, call for appt upon discharge)       Follow up with BATES,  DWIGHT, MD. (call for appt upon discharge)    Contact information:   [address] [place] [address] [phone]        The results of significant diagnostics from this hospitalization (including imaging, microbiology, ancillary and laboratory) are listed below for reference.    Significant Diagnostic Studies: Dg Chest 2 View  01/15/2013  *RADIOLOGY REPORT*  Clinical Data: Sore throat, URI symptoms  CHEST - 2 VIEW  Comparison: None.  Findings: Lungs are essentially clear.  No focal consolidation. No pleural effusion or pneumothorax.  Cardiomediastinal silhouette is within normal limits.  Visualized osseous structures are within normal limits.  IMPRESSION: No evidence of acute cardiopulmonary disease.   Original Report Authenticated By: Charline Bills, M.D.    Ct Soft Tissue Neck W Contrast  01/15/2013  *RADIOLOGY REPORT*  Clinical Data: Sore throat  CT NECK WITH CONTRAST  Technique:  Multidetector CT imaging of the neck was performed with intravenous contrast.  Contrast: 75mL OMNIPAQUE IOHEXOL 300 MG/ML  SOLN  Comparison: None.  Findings: Adenoidal tonsillar tissue is mildly prominent. Nasopharynx is widely patent.  Palatine tonsils are severely prominent resulting in narrowing of the oral pharyngeal airway. There is no peritonsillar abscess.  Lingual tonsillar tissue is unremarkable.  No evidence of abnormal adenopathy by measurement criteria. Jugular veins are patent bilaterally.  Carotid arteries are also grossly patent.  Floor of the mouth is unremarkable.  Submandibular glands are within normal limits.  Lung apices are clear.  Mastoid air cells are clear.  Small mucous retention cyst in the posterior right maxillary sinus.  Mucous retention cysts are present in the left sphenoid sinus.  Normal thyroid gland.  Mild degenerative changes in the cervical spine.  IMPRESSION: Palatine tonsils are markedly prominent compatible with an inflammatory process.  No evidence of a  peritonsillar abscess.  Mild prominence of the adenoidal lymphoid tissue.   Original Report Authenticated By: Jolaine Click, M.D.     Microbiology: Recent Results (from the past 240 hour(s))  RAPID STREP SCREEN     Status: None   Collection Time    01/15/13  3:31 PM      Result Value Range Status   Streptococcus, Group A Screen (Direct) NEGATIVE  NEGATIVE Final   Comment:            DUE TO INADEQUATE SENSITIVITY OF EIA     RAPID TESTS FOR GROUP A STREP (GAS)     IT IS RECOMMENDED THAT ALL NEGATIVE     RESULTS BE FOLLOWED BY A     GROUP A STREP PROBE.     Labs: Basic Metabolic Panel:  Recent Labs Lab 01/15/13 1653 01/16/13 0500 01/17/13 0500  NA 141 141 140  K 2.8* 4.0  3.3*  CL 105 107 106  CO2  --  23 26  GLUCOSE 103* 100* 88  BUN 8 7 4*  CREATININE 0.70 0.57 0.61  CALCIUM  --  8.6 8.5  MG  --  2.3  --    Liver Function Tests:  Recent Labs Lab 01/16/13 0500  AST 15  ALT 16  ALKPHOS 65  BILITOT 0.2*  PROT 7.1  ALBUMIN 3.1*   No results found for this basename: LIPASE, AMYLASE,  in the last 168 hours No results found for this basename: AMMONIA,  in the last 168 hours CBC:  Recent Labs Lab 01/15/13 1528 01/15/13 1653 01/16/13 0500 01/17/13 0500  WBC 17.2*  --  12.7* 8.0  NEUTROABS 13.5*  --  10.3*  --   HGB 12.5 13.3 10.8* 10.3*  HCT 36.3 39.0 32.0* 30.1*  MCV 76.3*  --  76.9* 76.4*  PLT 237  --  219 235   Cardiac Enzymes: No results found for this basename: CKTOTAL, CKMB, CKMBINDEX, TROPONINI,  in the last 168 hours BNP: BNP (last 3 results) No results found for this basename: PROBNP,  in the last 8760 hours CBG: No results found for this basename: GLUCAP,  in the last 168 hours     Signed:  Lilyanne Mcquown C  [facility] 01/17/2013, 1:27 PM

## 2013-01-17 NOTE — Progress Notes (Signed)
01/17/13 1500 In to speak with pt. about finding a PCP.  This NCM gave pt. community resources for locating a PCP.  Gave pt. contact information on local community clinics, such as [facility], and [facility] in [place] [employer].  In addition, gave pt. information about [facility], here at the hospital.  Pt. stated she would contact urgent care to obtain a PCP.  Pt. to dc home today. Tera Mater, RN, BSN NCM [phone]

## 2013-01-17 NOTE — Progress Notes (Signed)
Patient's K+ level is 3.3, MD notifiied and orders given; will continue to monitor patient. Lorretta Harp RN

## 2013-01-22 ENCOUNTER — Encounter (HOSPITAL_COMMUNITY): Payer: Self-pay | Admitting: *Deleted

## 2013-01-22 ENCOUNTER — Emergency Department (HOSPITAL_COMMUNITY)
Admission: EM | Admit: 2013-01-22 | Discharge: 2013-01-23 | Disposition: A | Discharge status: Home / Self Care | Payer: Medicaid Other | Attending: Emergency Medicine | Admitting: Emergency Medicine

## 2013-01-22 DIAGNOSIS — F17200 Nicotine dependence, unspecified, uncomplicated: Secondary | ICD-10-CM | POA: Insufficient documentation

## 2013-01-22 DIAGNOSIS — N938 Other specified abnormal uterine and vaginal bleeding: Secondary | ICD-10-CM | POA: Insufficient documentation

## 2013-01-22 DIAGNOSIS — B9689 Other specified bacterial agents as the cause of diseases classified elsewhere: Secondary | ICD-10-CM | POA: Insufficient documentation

## 2013-01-22 DIAGNOSIS — N949 Unspecified condition associated with female genital organs and menstrual cycle: Secondary | ICD-10-CM | POA: Insufficient documentation

## 2013-01-22 DIAGNOSIS — A499 Bacterial infection, unspecified: Secondary | ICD-10-CM | POA: Insufficient documentation

## 2013-01-22 DIAGNOSIS — Z3202 Encounter for pregnancy test, result negative: Secondary | ICD-10-CM | POA: Insufficient documentation

## 2013-01-22 DIAGNOSIS — Z9851 Tubal ligation status: Secondary | ICD-10-CM | POA: Insufficient documentation

## 2013-01-22 DIAGNOSIS — N760 Acute vaginitis: Secondary | ICD-10-CM | POA: Insufficient documentation

## 2013-01-22 LAB — CBC WITH DIFFERENTIAL/PLATELET
Basophils Absolute: 0.1 10*3/uL (ref 0.0–0.1)
Lymphocytes Relative: 44 % (ref 12–46)
Lymphs Abs: 4.2 10*3/uL — ABNORMAL HIGH (ref 0.7–4.0)
Neutro Abs: 4.6 10*3/uL (ref 1.7–7.7)
Neutrophils Relative %: 48 % (ref 43–77)
Platelets: 493 10*3/uL — ABNORMAL HIGH (ref 150–400)
RBC: 4.25 MIL/uL (ref 3.87–5.11)
RDW: 15.7 % — ABNORMAL HIGH (ref 11.5–15.5)
WBC: 9.6 10*3/uL (ref 4.0–10.5)

## 2013-01-22 LAB — COMPREHENSIVE METABOLIC PANEL
ALT: 20 U/L (ref 0–35)
AST: 16 U/L (ref 0–37)
Alkaline Phosphatase: 73 U/L (ref 39–117)
CO2: 28 mEq/L (ref 19–32)
Calcium: 9.6 mg/dL (ref 8.4–10.5)
GFR calc Af Amer: >90 mL/min (ref >90)
GFR calc non Af Amer: >90 mL/min (ref >90)
Glucose, Bld: 113 mg/dL — ABNORMAL HIGH (ref 70–99)
Potassium: 2.9 mEq/L — ABNORMAL LOW (ref 3.5–5.1)
Sodium: 139 mEq/L (ref 135–145)

## 2013-01-22 NOTE — ED Notes (Signed)
Lower abd pain  Since last Monday.  She has had a period for the past week and she thinks she is having a miscarriage.  lmp march.   Minimal blood flow

## 2013-01-23 LAB — URINALYSIS, ROUTINE W REFLEX MICROSCOPIC
Bilirubin Urine: NEGATIVE
Leukocytes, UA: NEGATIVE
Nitrite: NEGATIVE
Specific Gravity, Urine: 1.031 — ABNORMAL HIGH (ref 1.005–1.030)
Urobilinogen, UA: 0.2 mg/dL (ref 0.0–1.0)
pH: 5.5 (ref 5.0–8.0)

## 2013-01-23 LAB — GC/CHLAMYDIA PROBE AMP: GC Probe RNA: NEGATIVE

## 2013-01-23 LAB — PREGNANCY, URINE: Preg Test, Ur: NEGATIVE

## 2013-01-23 LAB — WET PREP, GENITAL: Yeast Wet Prep HPF POC: NONE SEEN

## 2013-01-23 LAB — URINE MICROSCOPIC-ADD ON

## 2013-01-23 MED ORDER — METRONIDAZOLE 500 MG PO TABS: 500.0000 mg | ORAL_TABLET | Freq: Two times a day (BID) | ORAL | Status: DC

## 2013-01-23 MED ORDER — METRONIDAZOLE 500 MG PO TABS
500.0000 mg | ORAL_TABLET | Freq: Once | ORAL | Status: AC
  Administered 2013-01-23: 500 mg via ORAL
  Filled 2013-01-23: qty 1

## 2013-01-23 NOTE — ED Provider Notes (Signed)
History     CSN: [id number]  Arrival date & time 01/22/13  2010   First MD Initiated Contact with Patient 01/22/13 2217      Chief Complaint  Patient presents with  . poss miscarriage      The history is provided by the patient.   patient reports some lower abdominal pain over the past 2 days but more importantly she is concerned about her abnormal vaginal bleeding.  Her last normal menstrual period was 01/03/2013.  She had 3 days of bleeding that did resolve.  Her bleeding is now continued.  His scant sometimes she only changes one pad a liner a day.  This however is abnormal for her and also brought her to the emergency department.  No new sexual contacts.  No nausea or vomiting.  No fevers or chills.  History reviewed. No pertinent past medical history.  Past Surgical History  Procedure Laterality Date  . Tubal ligation      Family History  Problem Relation Age of Onset  . Hypertension Mother   . Ovarian cancer Sister   . Hypertension Sister     History  Substance Use Topics  . Smoking status: Current Every Day Smoker  . Smokeless tobacco: Not on file  . Alcohol Use: Yes     Comment: occasionally    OB History   Grav Para Term Preterm Abortions TAB SAB Ect Mult Living                  Review of Systems  All other systems reviewed and are negative.    Allergies  Review of patient's allergies indicates no known allergies.  Home Medications   Current Outpatient Rx  Name  Route  Sig  Dispense  Refill  . acetaminophen (TYLENOL) 500 MG tablet   Oral   Take 1,000 mg by mouth every 6 (six) hours as needed for pain.         . clindamycin (CLEOCIN) 300 MG capsule   Oral   Take 1 capsule (300 mg total) by mouth 3 (three) times daily.   21 capsule   0   . HYDROcodone-acetaminophen (HYCET) 7.5-325 mg/15 ml solution   Oral   Take 10 mLs by mouth every 6 (six) hours as needed for pain.   120 mL   0   . saccharomyces boulardii (FLORASTOR) 250 MG capsule    Oral   Take 1 capsule (250 mg total) by mouth 2 (two) times daily.   20 capsule   0   . metroNIDAZOLE (FLAGYL) 500 MG tablet   Oral   Take 1 tablet (500 mg total) by mouth 2 (two) times daily.   14 tablet   0     BP 139/90  Pulse 76  Temp(Src) 98.5 F (36.9 C) (Oral)  Resp 18  SpO2 98%  LMP 01/05/2013 55 Physical Exam  Nursing note and vitals reviewed. Constitutional: She is oriented to person, place, and time. She appears well-developed and well-nourished. No distress.  HENT:  Head: Normocephalic and atraumatic.  Eyes: EOM are normal.  Neck: Normal range of motion.  Cardiovascular: Normal rate, regular rhythm and normal heart sounds.   Pulmonary/Chest: Effort normal and breath sounds normal.  Abdominal: Soft. She exhibits no distension. There is no tenderness.  Genitourinary:  Normal external genitalia.  Scant foul-smelling discharge.  Small amount of old blood in vaginal vault.  No clots.  No active bleeding.  No cervical motion tenderness.  No adnexal masses or fullness  Musculoskeletal: Normal range of motion.  Neurological: She is alert and oriented to person, place, and time.  Skin: Skin is warm and dry.  Psychiatric: She has a normal mood and affect. Judgment normal.    ED Course  Procedures (including critical care time)  Labs Reviewed  WET PREP, GENITAL - Abnormal; Notable for the following:    Clue Cells Wet Prep HPF POC MODERATE (*)    WBC, Wet Prep HPF POC FEW (*)    All other components within normal limits  URINALYSIS, ROUTINE W REFLEX MICROSCOPIC - Abnormal; Notable for the following:    APPearance CLOUDY (*)    Specific Gravity, Urine 1.031 (*)    Hgb urine dipstick LARGE (*)    All other components within normal limits  CBC WITH DIFFERENTIAL - Abnormal; Notable for the following:    Hemoglobin 11.2 (*)    HCT 32.8 (*)    MCV 77.2 (*)    RDW 15.7 (*)    Platelets 493 (*)    Lymphs Abs 4.2 (*)    All other components within normal limits   COMPREHENSIVE METABOLIC PANEL - Abnormal; Notable for the following:    Potassium 2.9 (*)    Glucose, Bld 113 (*)    Total Bilirubin 0.2 (*)    All other components within normal limits  URINE MICROSCOPIC-ADD ON - Abnormal; Notable for the following:    Squamous Epithelial / LPF MANY (*)    Bacteria, UA MANY (*)    All other components within normal limits  GC/CHLAMYDIA PROBE AMP  PREGNANCY, URINE  HCG, SERUM, QUALITATIVE   No results found.   1. DUB (dysfunctional uterine bleeding)   2. BV (bacterial vaginosis)       MDM  Dysfunctional uterine bleeding.  Pregnancy test is negative.  Discharge home with Flagyl for what appears to be bacterial vaginosis.  Her abdomen is benign.  Discharge home in good condition.        Lyanne Co, MD 01/23/13 [phone]

## 2013-02-28 ENCOUNTER — Emergency Department (HOSPITAL_COMMUNITY)
Admission: EM | Admit: 2013-02-28 | Discharge: 2013-02-28 | Disposition: A | Discharge status: Home / Self Care | Payer: Self-pay | Attending: Emergency Medicine | Admitting: Emergency Medicine

## 2013-02-28 ENCOUNTER — Encounter (HOSPITAL_COMMUNITY): Payer: Self-pay | Admitting: Emergency Medicine

## 2013-02-28 DIAGNOSIS — J039 Acute tonsillitis, unspecified: Secondary | ICD-10-CM

## 2013-02-28 DIAGNOSIS — Z87891 Personal history of nicotine dependence: Secondary | ICD-10-CM | POA: Insufficient documentation

## 2013-02-28 DIAGNOSIS — J3501 Chronic tonsillitis: Secondary | ICD-10-CM | POA: Insufficient documentation

## 2013-02-28 MED ORDER — CLINDAMYCIN HCL 150 MG PO CAPS: 450.0000 mg | ORAL_CAPSULE | Freq: Three times a day (TID) | ORAL | Status: DC

## 2013-02-28 MED ORDER — DEXAMETHASONE SODIUM PHOSPHATE 10 MG/ML IJ SOLN
10.0000 mg | Freq: Once | INTRAMUSCULAR | Status: AC
  Administered 2013-02-28: 10 mg via INTRAMUSCULAR
  Filled 2013-02-28: qty 1

## 2013-02-28 MED ORDER — HYDROCODONE-ACETAMINOPHEN 7.5-500 MG/15ML PO SOLN: 10.0000 mL | Freq: Four times a day (QID) | ORAL | Status: DC | PRN

## 2013-02-28 NOTE — ED Provider Notes (Signed)
History     CSN: [id number]  Arrival date & time 02/28/13  1022   First MD Initiated Contact with Patient 02/28/13 1046      No chief complaint on file.   (Consider location/radiation/quality/duration/timing/severity/associated sxs/prior treatment) HPI Comments: Patient presents with a chief complaint of sore throat and swelling of her tonsils.  Symptoms began yesterday.  She was admitted to the hospital approximately six weeks ago for Tonsillitis.  She was given IV Clindamycin during her admission and discharged home with prescription for Clindamycin.  She reports that she completed her antibiotic at that time and that it helped.  She was also given referral to ENT, but never followed up.  She reports that her symptoms are not as severe at this time, but that she came in so that she could catch it early.  She denies any difficulty swallowing at this time.  No drooling.    Patient is a 37 y.o. female presenting with pharyngitis. The history is provided by the patient.  Sore Throat This is a recurrent problem. The problem occurs constantly. The problem has been gradually worsening. Associated symptoms include a sore throat and swollen glands. Pertinent negatives include no abdominal pain, chills, coughing, fever, nausea, neck pain, rash or vomiting. The symptoms are aggravated by swallowing. She has tried NSAIDs for the symptoms. The treatment provided mild relief.    History reviewed. No pertinent past medical history.  Past Surgical History  Procedure Laterality Date  . Tubal ligation      Family History  Problem Relation Age of Onset  . Hypertension Mother   . Ovarian cancer Sister   . Hypertension Sister     History  Substance Use Topics  . Smoking status: Former Games developer  . Smokeless tobacco: Not on file  . Alcohol Use: Yes     Comment: occasionally    OB History   Grav Para Term Preterm Abortions TAB SAB Ect Mult Living                  Review of Systems   Constitutional: Negative for fever and chills.  HENT: Positive for sore throat. Negative for drooling, trouble swallowing, neck pain, neck stiffness and voice change.   Respiratory: Negative for cough.   Gastrointestinal: Negative for nausea, vomiting and abdominal pain.  Skin: Negative for rash.  All other systems reviewed and are negative.    Allergies  Review of patient's allergies indicates no known allergies.  Home Medications  No current outpatient prescriptions on file.  BP 135/83  Pulse 85  Temp(Src) 99 F (37.2 C) (Oral)  SpO2 98%  LMP 02/12/2013  Physical Exam  Nursing note and vitals reviewed. Constitutional: She appears well-developed and well-nourished.  HENT:  Head: Normocephalic and atraumatic.  Right Ear: Tympanic membrane and ear canal normal.  Left Ear: Tympanic membrane and ear canal normal.  Nose: Nose normal.  Mouth/Throat: Uvula is midline. Posterior oropharyngeal edema present. No oropharyngeal exudate or tonsillar abscesses.  Mild swelling of the tonsils bilaterally Uvula midline  Normal voice phonation   Patient handling secretions well, no drooling. No trismus  Cardiovascular: Normal rate, regular rhythm and normal heart sounds.   Pulmonary/Chest: Effort normal and breath sounds normal.    ED Course  Procedures (including critical care time)  Labs Reviewed  RAPID STREP SCREEN   No results found.   No diagnosis found.    MDM  Patient presenting with sore throat.  Rapid strep negative.  Patient hospitalized last month for Tonsillitis.  Patient able to swallow.  No drooling.  Uvula midline.  No signs of peritonsillar or retropharyngeal abscess at this time.  Patient stable for discharge.  Patient instructed to follow up with ENT.  Return precautions given.        Pascal Lux [place], PA-C 03/02/13 0850  Pascal Lux [place], PA-C 03/05/13 [phone]

## 2013-02-28 NOTE — ED Notes (Signed)
Pt c/o sore throat and difficulty swallowing. Was here last month (?) for same and was admitted for 3 days. Was supposed to f/u with ENT but has not done so.

## 2013-03-05 NOTE — ED Provider Notes (Signed)
Medical screening examination/treatment/procedure(s) were performed by non-physician practitioner and as supervising physician I was immediately available for consultation/collaboration.   Carleene Cooper III, MD 03/05/13 [phone]

## 2014-06-28 ENCOUNTER — Emergency Department (HOSPITAL_BASED_OUTPATIENT_CLINIC_OR_DEPARTMENT_OTHER)
Admission: EM | Admit: 2014-06-28 | Discharge: 2014-06-28 | Disposition: A | Discharge status: Home / Self Care | Payer: No Typology Code available for payment source | Attending: Emergency Medicine | Admitting: Emergency Medicine

## 2014-06-28 ENCOUNTER — Encounter (HOSPITAL_BASED_OUTPATIENT_CLINIC_OR_DEPARTMENT_OTHER): Payer: Self-pay | Admitting: Emergency Medicine

## 2014-06-28 DIAGNOSIS — S4980XA Other specified injuries of shoulder and upper arm, unspecified arm, initial encounter: Secondary | ICD-10-CM | POA: Insufficient documentation

## 2014-06-28 DIAGNOSIS — Y9389 Activity, other specified: Secondary | ICD-10-CM | POA: Insufficient documentation

## 2014-06-28 DIAGNOSIS — IMO0002 Reserved for concepts with insufficient information to code with codable children: Secondary | ICD-10-CM | POA: Insufficient documentation

## 2014-06-28 DIAGNOSIS — Z792 Long term (current) use of antibiotics: Secondary | ICD-10-CM | POA: Insufficient documentation

## 2014-06-28 DIAGNOSIS — Y92410 Unspecified street and highway as the place of occurrence of the external cause: Secondary | ICD-10-CM | POA: Insufficient documentation

## 2014-06-28 DIAGNOSIS — Z87891 Personal history of nicotine dependence: Secondary | ICD-10-CM | POA: Insufficient documentation

## 2014-06-28 DIAGNOSIS — S46909A Unspecified injury of unspecified muscle, fascia and tendon at shoulder and upper arm level, unspecified arm, initial encounter: Secondary | ICD-10-CM | POA: Insufficient documentation

## 2014-06-28 MED ORDER — HYDROCODONE-ACETAMINOPHEN 5-325 MG PO TABS: 1.0000 | ORAL_TABLET | Freq: Four times a day (QID) | ORAL | Status: DC | PRN

## 2014-06-28 MED ORDER — HYDROCODONE-ACETAMINOPHEN 5-325 MG PO TABS
2.0000 | ORAL_TABLET | Freq: Once | ORAL | Status: AC
  Administered 2014-06-28: 2 via ORAL
  Filled 2014-06-28: qty 2
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
## 2014-07-01 ENCOUNTER — Encounter (HOSPITAL_BASED_OUTPATIENT_CLINIC_OR_DEPARTMENT_OTHER): Payer: Self-pay | Admitting: Emergency Medicine

## 2014-07-01 ENCOUNTER — Emergency Department (HOSPITAL_BASED_OUTPATIENT_CLINIC_OR_DEPARTMENT_OTHER): Payer: No Typology Code available for payment source

## 2014-07-01 ENCOUNTER — Emergency Department (HOSPITAL_BASED_OUTPATIENT_CLINIC_OR_DEPARTMENT_OTHER)
Admission: EM | Admit: 2014-07-01 | Discharge: 2014-07-01 | Disposition: A | Discharge status: Home / Self Care | Payer: No Typology Code available for payment source | Attending: Emergency Medicine | Admitting: Emergency Medicine

## 2014-07-01 DIAGNOSIS — Z792 Long term (current) use of antibiotics: Secondary | ICD-10-CM | POA: Diagnosis not present

## 2014-07-01 DIAGNOSIS — S161XXA Strain of muscle, fascia and tendon at neck level, initial encounter: Secondary | ICD-10-CM

## 2014-07-01 DIAGNOSIS — M545 Low back pain, unspecified: Secondary | ICD-10-CM

## 2014-07-01 DIAGNOSIS — Y92410 Unspecified street and highway as the place of occurrence of the external cause: Secondary | ICD-10-CM | POA: Diagnosis not present

## 2014-07-01 DIAGNOSIS — S0990XA Unspecified injury of head, initial encounter: Secondary | ICD-10-CM | POA: Insufficient documentation

## 2014-07-01 DIAGNOSIS — Y9389 Activity, other specified: Secondary | ICD-10-CM | POA: Diagnosis not present

## 2014-07-01 DIAGNOSIS — S139XXA Sprain of joints and ligaments of unspecified parts of neck, initial encounter: Secondary | ICD-10-CM | POA: Diagnosis not present

## 2014-07-01 DIAGNOSIS — Z87891 Personal history of nicotine dependence: Secondary | ICD-10-CM | POA: Diagnosis not present

## 2014-07-01 DIAGNOSIS — IMO0002 Reserved for concepts with insufficient information to code with codable children: Secondary | ICD-10-CM | POA: Insufficient documentation

## 2014-07-01 DIAGNOSIS — G4489 Other headache syndrome: Secondary | ICD-10-CM | POA: Diagnosis not present

## 2014-07-01 MED ORDER — CYCLOBENZAPRINE HCL 5 MG PO TABS: 5.0000 mg | ORAL_TABLET | Freq: Two times a day (BID) | ORAL | Status: DC | PRN

## 2014-07-01 MED ORDER — IBUPROFEN 800 MG PO TABS: 800.0000 mg | ORAL_TABLET | Freq: Three times a day (TID) | ORAL | Status: DC

## 2014-07-01 NOTE — ED Notes (Signed)
Patient transported to X-ray 

## 2014-07-01 NOTE — ED Provider Notes (Signed)
Medical screening examination/treatment/procedure(s) were performed by non-physician practitioner and as supervising physician I was immediately available for consultation/collaboration.   EKG Interpretation None       Roshard Rezabek K Linker, MD 07/01/14 1832 
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
## 2014-07-01 NOTE — ED Notes (Signed)
D/c home with ride- rx x 2 given for flexeril and ibuprofen- note for work given
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
## 2014-07-04 IMAGING — CT CT NECK W/ CM
2 series · 10 of 14 positions shown, 12 images · IV contrast (omnipaque)
Comparison: None.

CLINICAL DATA: Sore throat

CT NECK WITH CONTRAST
TECHNIQUE: Multidetector CT imaging of the neck was performed with
intravenous contrast.
Contrast: 75mL OMNIPAQUE IOHEXOL 300 MG/ML  SOLN

[Series 2: 2cc/30ml and 1cc/45ml · axial · 0.49mm/px · z∈[+86,+258]mm · 8 of 89 slices shown, 10 images]
[im 10/89  soft-tissue]
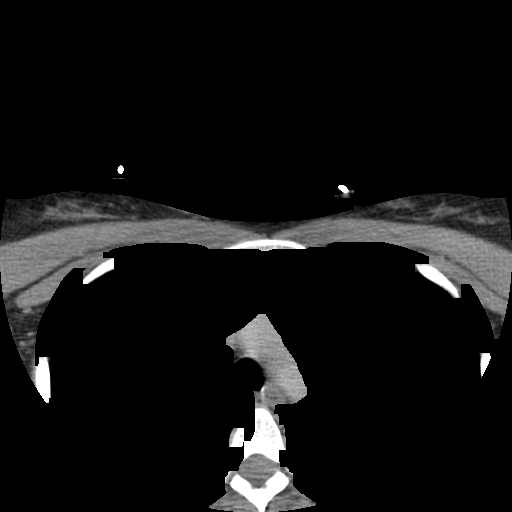
[im 10/89  bone]
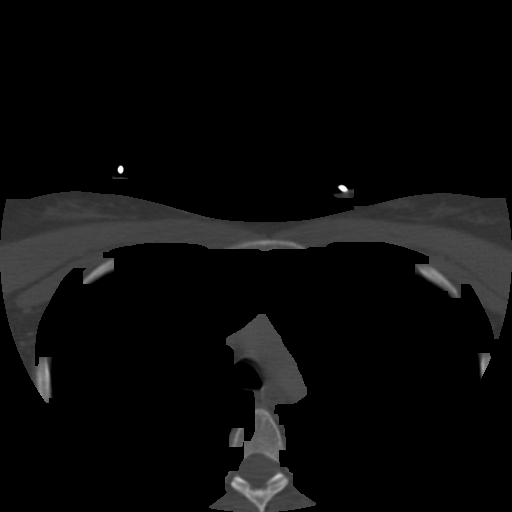
[im 20/89  bone]
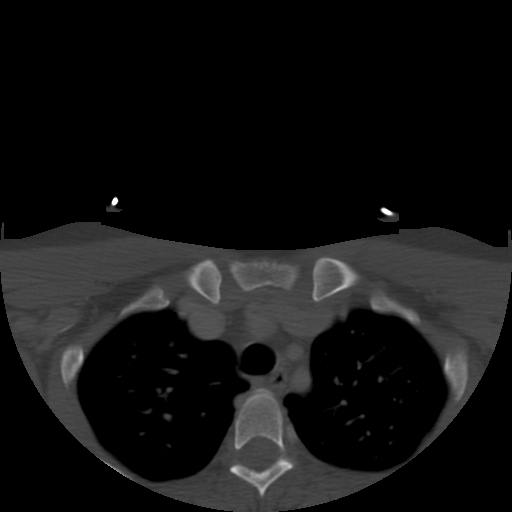
[im 30/89  bone]
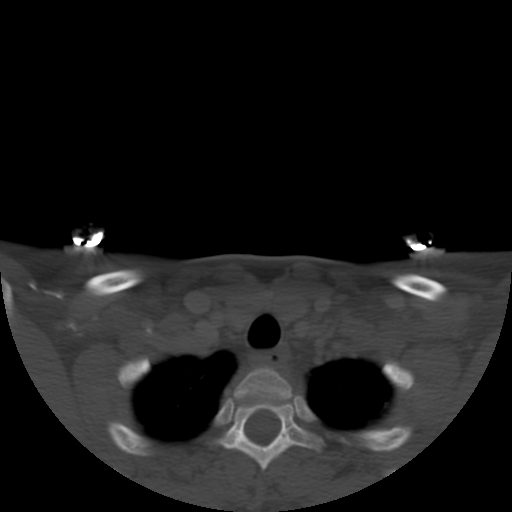
[im 40/89  bone]
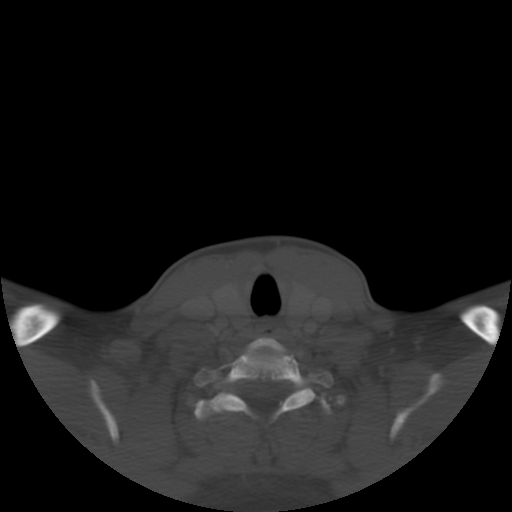
[im 49/89  soft-tissue]
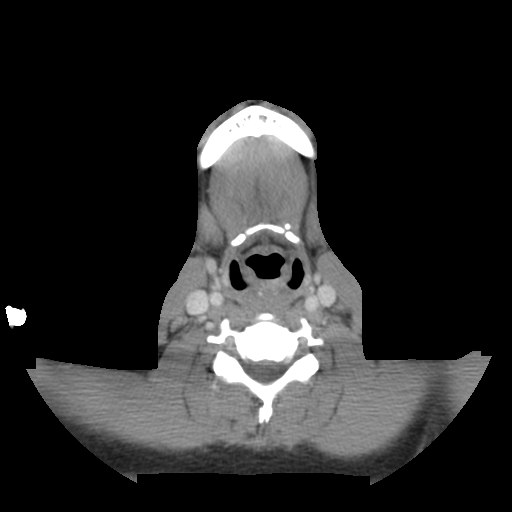
[im 49/89  bone]
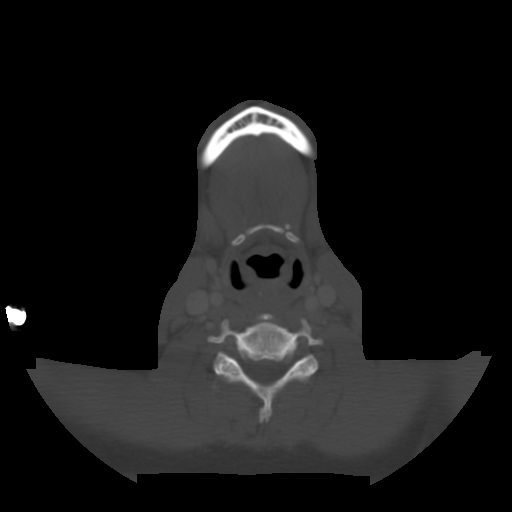
[im 59/89  bone]
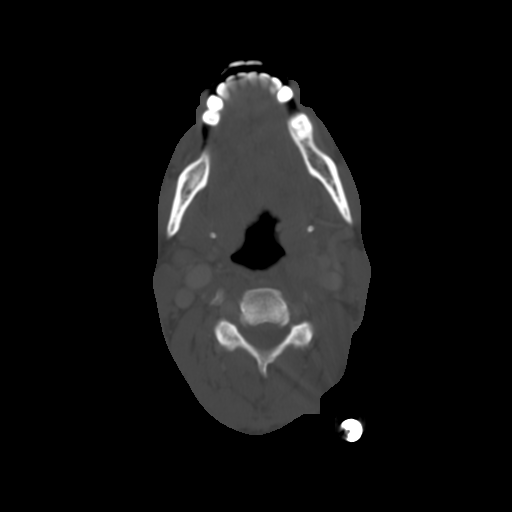
[im 69/89  bone]
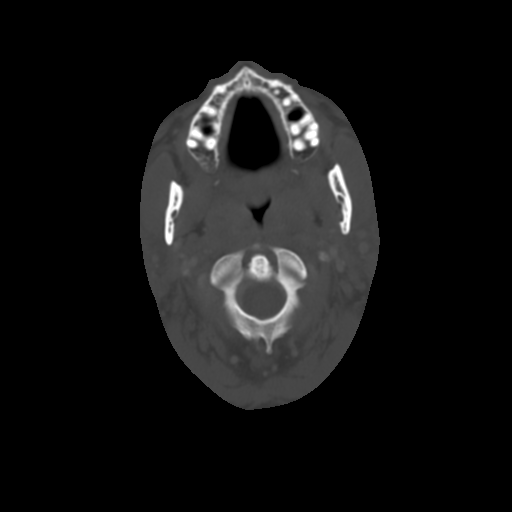
[im 79/89  bone]
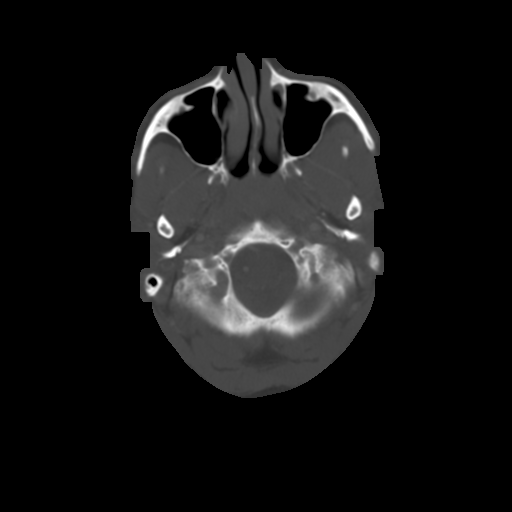

[Series 4: recon 3: 2cc/30ml and 1cc/45ml · axial · 0.49mm/px · z∈[+93,+123]mm · 2 of 38 slices shown]
[im 13/38  bone]
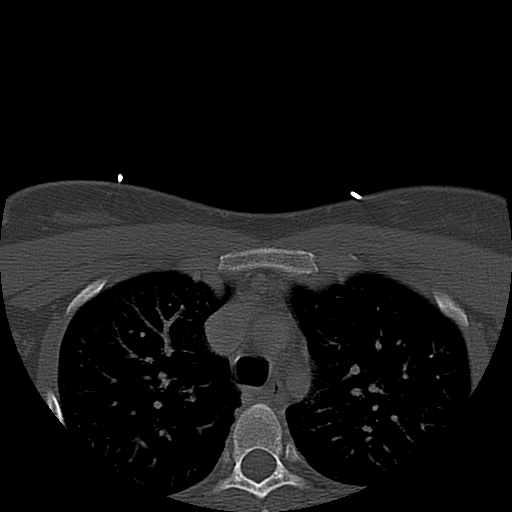
[im 25/38  bone]
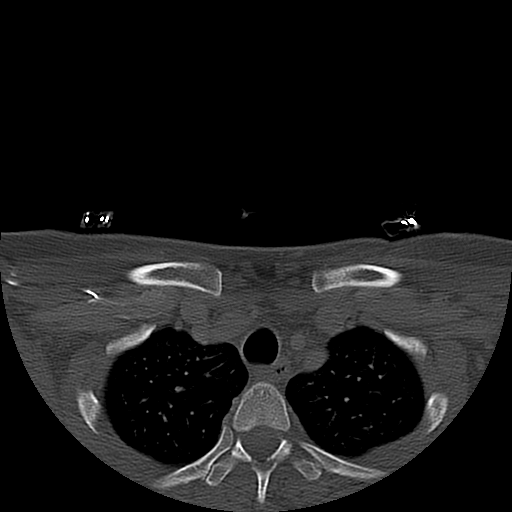

[10 of 14 positions shown; findings below may reference images not displayed]

FINDINGS: Adenoidal tonsillar tissue is mildly prominent.
Nasopharynx is widely patent.  Palatine tonsils are severely
prominent resulting in narrowing of the oral pharyngeal airway.
There is no peritonsillar abscess.  Lingual tonsillar tissue is
unremarkable.

No evidence of abnormal adenopathy by measurement criteria.
Jugular veins are patent bilaterally.  Carotid arteries are also
grossly patent.

Floor of the mouth is unremarkable.  Submandibular glands are
within normal limits.  Lung apices are clear.

Mastoid air cells are clear.  Small mucous retention cyst in the
posterior right maxillary sinus.  Mucous retention cysts are
present in the left sphenoid sinus.

Normal thyroid gland.  Mild degenerative changes in the cervical
spine.
IMPRESSION: Palatine tonsils are markedly prominent compatible with an
inflammatory process.  No evidence of a peritonsillar abscess.

Mild prominence of the adenoidal lymphoid tissue.

## 2014-07-17 ENCOUNTER — Emergency Department (HOSPITAL_COMMUNITY): Payer: Self-pay

## 2014-07-17 ENCOUNTER — Encounter (HOSPITAL_COMMUNITY): Payer: Self-pay | Admitting: Emergency Medicine

## 2014-07-17 ENCOUNTER — Emergency Department (HOSPITAL_COMMUNITY)
Admission: EM | Admit: 2014-07-17 | Discharge: 2014-07-17 | Disposition: A | Discharge status: Home / Self Care | Payer: Self-pay | Attending: Emergency Medicine | Admitting: Emergency Medicine

## 2014-07-17 ENCOUNTER — Emergency Department (HOSPITAL_COMMUNITY): Payer: No Typology Code available for payment source

## 2014-07-17 DIAGNOSIS — R079 Chest pain, unspecified: Secondary | ICD-10-CM | POA: Insufficient documentation

## 2014-07-17 DIAGNOSIS — R0602 Shortness of breath: Secondary | ICD-10-CM | POA: Insufficient documentation

## 2014-07-17 DIAGNOSIS — R000 Tachycardia, unspecified: Secondary | ICD-10-CM | POA: Insufficient documentation

## 2014-07-17 DIAGNOSIS — R600 Localized edema: Secondary | ICD-10-CM

## 2014-07-17 DIAGNOSIS — Z87891 Personal history of nicotine dependence: Secondary | ICD-10-CM | POA: Insufficient documentation

## 2014-07-17 DIAGNOSIS — M7989 Other specified soft tissue disorders: Secondary | ICD-10-CM | POA: Insufficient documentation

## 2014-07-17 LAB — BASIC METABOLIC PANEL
Anion gap: 15 (ref 5–15)
BUN: 11 mg/dL (ref 6–23)
CO2: 23 mEq/L (ref 19–32)
Calcium: 8.9 mg/dL (ref 8.4–10.5)
Chloride: 103 mEq/L (ref 96–112)
Creatinine, Ser: 0.74 mg/dL (ref 0.50–1.10)
Glucose, Bld: 104 mg/dL — ABNORMAL HIGH (ref 70–99)
POTASSIUM: 3.4 meq/L — AB (ref 3.7–5.3)
SODIUM: 141 meq/L (ref 137–147)

## 2014-07-17 LAB — CBC
HCT: 31.1 % — ABNORMAL LOW (ref 36.0–46.0)
Hemoglobin: 10.1 g/dL — ABNORMAL LOW (ref 12.0–15.0)
MCH: 24.4 pg — ABNORMAL LOW (ref 26.0–34.0)
MCHC: 32.5 g/dL (ref 30.0–36.0)
MCV: 75.1 fL — ABNORMAL LOW (ref 78.0–100.0)
Platelets: 312 10*3/uL (ref 150–400)
RBC: 4.14 MIL/uL (ref 3.87–5.11)
RDW: 16.2 % — AB (ref 11.5–15.5)
WBC: 6.7 10*3/uL (ref 4.0–10.5)

## 2014-07-17 LAB — I-STAT TROPONIN, ED: TROPONIN I, POC: 0 ng/mL (ref 0.00–0.08)

## 2014-07-17 LAB — PRO B NATRIURETIC PEPTIDE: PRO B NATRI PEPTIDE: 47.6 pg/mL (ref 0–125)

## 2014-07-17 MED ORDER — SODIUM CHLORIDE 0.9 % IV BOLUS (SEPSIS)
1000.0000 mL | Freq: Once | INTRAVENOUS | Status: AC
  Administered 2014-07-17: 1000 mL via INTRAVENOUS

## 2014-07-17 MED ORDER — HYDROCODONE-ACETAMINOPHEN 5-325 MG PO TABS
2.0000 | ORAL_TABLET | Freq: Once | ORAL | Status: AC
  Administered 2014-07-17: 2 via ORAL
  Filled 2014-07-17: qty 2

## 2014-07-17 MED ORDER — IOHEXOL 350 MG/ML SOLN
100.0000 mL | Freq: Once | INTRAVENOUS | Status: AC | PRN
  Administered 2014-07-17: 100 mL via INTRAVENOUS

## 2014-07-17 NOTE — Progress Notes (Signed)
*  PRELIMINARY RESULTS* Vascular Ultrasound Lower extremity venous duplex has been completed.  Preliminary findings: no evidence of DVT.   Farrel DemarkJill Eunice, RDMS, RVT  07/17/2014, 11:24 AM

## 2014-07-17 NOTE — Discharge Instructions (Signed)
As discussed, your evaluation today has been largely reassuring.  But, it is important that you monitor your condition carefully, and do not hesitate to return to the ED if you develop new, or concerning changes in your condition. ? ?Otherwise, please follow-up with your physician for appropriate ongoing care. ? ?

## 2014-07-17 NOTE — ED Notes (Signed)
Pt. report left chest pain radiating to left axilla and left arm onset this morning with SOB  , nausea and diaphoresis .

## 2014-07-17 NOTE — ED Notes (Signed)
Pt returned from doppler.

## 2014-07-17 NOTE — ED Provider Notes (Addendum)
CSN: [id number]     Arrival date & time 07/17/14  0415 History   First MD Initiated Contact with Patient 07/17/14 0602     Chief Complaint  Patient presents with  . Chest Pain     (Consider location/radiation/quality/duration/timing/severity/associated sxs/prior Treatment) HPI Stacie Stone is a 38 y.o. female with no significant past medical history coming in with chest pain. Patient states it's left-sided under her breasts, it is sharp in nature with radiation to her bilateral arms. She has numbness and tingling in her bilateral arms. It is associated with shortness of breath. Has no emesis or diaphoresis. Nothing makes the pain better or worse. She also admits to bilateral lower extremity swelling for the past one month with pain. She denies any history of blood clots or ACS. There is no history of blood clots in her family. Patient has had no recent long distance travel, surgery, or exogenous estrogen. She states there has been cancer in the family but she has no history of malignancy. Patient has no further complaints.  10 Systems reviewed and are negative for acute change except as noted in the HPI.     History reviewed. No pertinent past medical history. Past Surgical History  Procedure Laterality Date  . Tubal ligation    . Tubal ligation     Family History  Problem Relation Age of Onset  . Hypertension Mother   . Ovarian cancer Sister   . Hypertension Sister    History  Substance Use Topics  . Smoking status: Former Games developer  . Smokeless tobacco: Not on file  . Alcohol Use: Yes     Comment: occasionally   OB History   Grav Para Term Preterm Abortions TAB SAB Ect Mult Living                 Review of Systems    Allergies  Review of patient's allergies indicates no known allergies.  Home Medications   Prior to Admission medications   Medication Sig Start Date End Date Taking? Authorizing Provider  cyclobenzaprine (FLEXERIL) 5 MG tablet Take 5 mg by mouth 2  (two) times daily as needed for muscle spasms.   Yes Historical Provider, MD  HYDROcodone-acetaminophen (NORCO/VICODIN) 5-325 MG per tablet Take 1-2 tablets by mouth every 6 (six) hours as needed for moderate pain.   Yes Historical Provider, MD   BP 136/66  Pulse 83  Temp(Src) 98.8 F (37.1 C) (Oral)  Resp 15  SpO2 99%  LMP 06/29/2014 Physical Exam  Nursing note and vitals reviewed. Constitutional: She is oriented to person, place, and time. She appears well-developed and well-nourished. No distress.  HENT:  Head: Normocephalic and atraumatic.  Nose: Nose normal.  Mouth/Throat: Oropharynx is clear and moist. No oropharyngeal exudate.  Eyes: Conjunctivae and EOM are normal. Pupils are equal, round, and reactive to light. No scleral icterus.  Neck: Normal range of motion. Neck supple. No JVD present. No tracheal deviation present. No thyromegaly present.  Cardiovascular: Normal rate, regular rhythm and normal heart sounds.  Exam reveals no gallop and no friction rub.   No murmur heard. Pulmonary/Chest: Effort normal and breath sounds normal. No respiratory distress. She has no wheezes. She exhibits no tenderness.  Abdominal: Soft. Bowel sounds are normal. She exhibits no distension and no mass. There is no tenderness. There is no rebound and no guarding.  Musculoskeletal: Normal range of motion. She exhibits edema. She exhibits no tenderness.  1+ bilateral lower edema to the mid tibia. There is tenderness  to palpation in the muscle belly and Positive Homans sign bilaterally.  Lymphadenopathy:    She has no cervical adenopathy.  Neurological: She is alert and oriented to person, place, and time.  Skin: Skin is warm and dry. No rash noted. No erythema. No pallor.    ED Course  Procedures (including critical care time) Labs Review Labs Reviewed  CBC - Abnormal; Notable for the following:    Hemoglobin 10.1 (*)    HCT 31.1 (*)    MCV 75.1 (*)    MCH 24.4 (*)    RDW 16.2 (*)    All  other components within normal limits  BASIC METABOLIC PANEL - Abnormal; Notable for the following:    Potassium 3.4 (*)    Glucose, Bld 104 (*)    All other components within normal limits  PRO B NATRIURETIC PEPTIDE  I-STAT TROPOININ, ED    Imaging Review Dg Chest 2 View  07/17/2014   CLINICAL DATA:  Chest pain  EXAM: CHEST  2 VIEW  COMPARISON:  01/15/2013  FINDINGS: The heart size and mediastinal contours are within normal limits. Both lungs are clear. The visualized skeletal structures are unremarkable.  IMPRESSION: No active cardiopulmonary disease.   Electronically Signed   By: Burman NievesWilliam  Stevens M.D.   On: 07/17/2014 04:45     EKG Interpretation None    MUSE not working: NSR, rate 80, normal EKG, no ischemic changes or right heart strain   MDM   Final diagnoses:  None    Patient presents emergency department out of concern for chest pain shortness of breath. In the setting of bilateral lower extremity edema and tachycardia, concern for pulmonary embolism in this patient. Because ultrasound is not not readily available this morning I will begin with a CT scan of her chest for evaluation. Patient will likely be signed out to oncoming physician please see his note for ultimate disposition.     Tomasita CrumbleAdeleke Therron Sells, MD 07/17/14 [id number]  Tomasita CrumbleAdeleke Linsie Lupo, MD 07/17/14 [phone]

## 2014-07-17 NOTE — ED Provider Notes (Signed)
On my exam after the patient returned from ultrasound, she was in no distress. We discussed all findings, including reassuring CT scan, ultrasound. Patient has no ongoing complaints beyond lower extremity edema.   I arranged for case management to [facility]  Gerhard Munchobert Joseh Sjogren, MD 07/17/14 1254

## 2014-07-25 NOTE — Discharge Planning (Signed)
[facility] Eligibility Specialist was unable to see the patient, [facility] orange card information and primary care resources will be sent to the address provided in epic.

## 2015-08-24 ENCOUNTER — Encounter (HOSPITAL_BASED_OUTPATIENT_CLINIC_OR_DEPARTMENT_OTHER): Payer: Self-pay | Admitting: *Deleted

## 2015-08-24 ENCOUNTER — Emergency Department (HOSPITAL_BASED_OUTPATIENT_CLINIC_OR_DEPARTMENT_OTHER): Payer: 59

## 2015-08-24 ENCOUNTER — Emergency Department (HOSPITAL_BASED_OUTPATIENT_CLINIC_OR_DEPARTMENT_OTHER)
Admission: EM | Admit: 2015-08-24 | Discharge: 2015-08-24 | Disposition: A | Discharge status: Home / Self Care | Payer: 59 | Attending: Emergency Medicine | Admitting: Emergency Medicine

## 2015-08-24 DIAGNOSIS — R519 Headache, unspecified: Secondary | ICD-10-CM

## 2015-08-24 DIAGNOSIS — R51 Headache: Secondary | ICD-10-CM

## 2015-08-24 DIAGNOSIS — Z87891 Personal history of nicotine dependence: Secondary | ICD-10-CM | POA: Diagnosis not present

## 2015-08-24 DIAGNOSIS — J069 Acute upper respiratory infection, unspecified: Secondary | ICD-10-CM

## 2015-08-24 LAB — CBC WITH DIFFERENTIAL/PLATELET
Basophils Absolute: 0 10*3/uL (ref 0.0–0.1)
Basophils Relative: 0 %
EOS PCT: 1 %
Eosinophils Absolute: 0 10*3/uL (ref 0.0–0.7)
HCT: 36.1 % (ref 36.0–46.0)
Hemoglobin: 12.1 g/dL (ref 12.0–15.0)
LYMPHS PCT: 40 %
Lymphs Abs: 2.4 10*3/uL (ref 0.7–4.0)
MCH: 26.2 pg (ref 26.0–34.0)
MCHC: 33.5 g/dL (ref 30.0–36.0)
MCV: 78.3 fL (ref 78.0–100.0)
MONO ABS: 0.4 10*3/uL (ref 0.1–1.0)
MONOS PCT: 7 %
Neutro Abs: 3.1 10*3/uL (ref 1.7–7.7)
Neutrophils Relative %: 52 %
PLATELETS: 279 10*3/uL (ref 150–400)
RBC: 4.61 MIL/uL (ref 3.87–5.11)
RDW: 16.5 % — AB (ref 11.5–15.5)
WBC: 5.9 10*3/uL (ref 4.0–10.5)

## 2015-08-24 LAB — COMPREHENSIVE METABOLIC PANEL
ALT: 10 U/L — ABNORMAL LOW (ref 14–54)
ANION GAP: 10 (ref 5–15)
AST: 13 U/L — AB (ref 15–41)
Albumin: 4.2 g/dL (ref 3.5–5.0)
Alkaline Phosphatase: 55 U/L (ref 38–126)
BILIRUBIN TOTAL: 0.7 mg/dL (ref 0.3–1.2)
BUN: 12 mg/dL (ref 6–20)
CHLORIDE: 108 mmol/L (ref 101–111)
CO2: 22 mmol/L (ref 22–32)
Calcium: 9.7 mg/dL (ref 8.9–10.3)
Creatinine, Ser: 0.57 mg/dL (ref 0.44–1.00)
Glucose, Bld: 90 mg/dL (ref 65–99)
POTASSIUM: 3.1 mmol/L — AB (ref 3.5–5.1)
Sodium: 140 mmol/L (ref 135–145)
TOTAL PROTEIN: 7.9 g/dL (ref 6.5–8.1)

## 2015-08-24 LAB — LIPASE, BLOOD: LIPASE: 25 U/L (ref 11–51)

## 2015-08-24 MED ORDER — PSEUDOEPHEDRINE HCL 60 MG PO TABS: 60.0000 mg | ORAL_TABLET | Freq: Four times a day (QID) | ORAL | Status: DC | PRN

## 2015-08-24 MED ORDER — PROCHLORPERAZINE EDISYLATE 5 MG/ML IJ SOLN
10.0000 mg | Freq: Once | INTRAMUSCULAR | Status: AC
  Administered 2015-08-24: 10 mg via INTRAVENOUS
  Filled 2015-08-24: qty 2

## 2015-08-24 MED ORDER — POTASSIUM CHLORIDE CRYS ER 20 MEQ PO TBCR
40.0000 meq | EXTENDED_RELEASE_TABLET | Freq: Once | ORAL | Status: AC
  Administered 2015-08-24: 40 meq via ORAL
  Filled 2015-08-24: qty 2

## 2015-08-24 MED ORDER — KETOROLAC TROMETHAMINE 30 MG/ML IJ SOLN
30.0000 mg | Freq: Once | INTRAMUSCULAR | Status: AC
  Administered 2015-08-24: 30 mg via INTRAVENOUS
  Filled 2015-08-24: qty 1

## 2015-08-24 MED ORDER — SODIUM CHLORIDE 0.9 % IV BOLUS (SEPSIS)
1000.0000 mL | Freq: Once | INTRAVENOUS | Status: AC
  Administered 2015-08-24: 1000 mL via INTRAVENOUS

## 2015-08-24 MED ORDER — DIPHENHYDRAMINE HCL 50 MG/ML IJ SOLN
25.0000 mg | Freq: Once | INTRAMUSCULAR | Status: AC
  Administered 2015-08-24: 25 mg via INTRAVENOUS
  Filled 2015-08-24: qty 1

## 2015-08-24 MED ORDER — ACETAMINOPHEN 325 MG PO TABS
650.0000 mg | ORAL_TABLET | Freq: Once | ORAL | Status: AC
  Administered 2015-08-24: 650 mg via ORAL
  Filled 2015-08-24: qty 2

## 2015-08-24 NOTE — ED Notes (Signed)
PA at bedside.

## 2015-08-24 NOTE — ED Notes (Signed)
Patient transported to X-ray and returned at this time 

## 2015-08-24 NOTE — ED Notes (Signed)
Directed to pharmacy to pick up prescriptions- pt d/c home with a ride

## 2015-08-24 NOTE — ED Provider Notes (Signed)
CSN: [id number]     Arrival date & time 08/24/15  0945 History   First MD Initiated Contact with Patient 08/24/15 1011     Chief Complaint  Patient presents with  . Headache     (Consider location/radiation/quality/duration/timing/severity/associated sxs/prior Treatment) HPI Comments: Patient is a 39 year old female presents to the ED with complaint of headache, onset 4 days. Patient reports having gradual onset throbbing headache located to her forehead that has gradually worsened. Denies any aggravating or alleviating factors. She reports having associated chills, body aches, weakness, SOB, nausea, light sensitivity, congestion, rhinorrhea. She reports having intermittent dizziness that occurs upon standing and is relieved with sitting or laying down. Denies fever, visual changes, neck stiffness, chest pain, wheezing, abdominal pain, urinary symptoms, vomiting, numbness, tingling, weakness. She notes she took an aspirin yesterday without relief. Denies history of migraines. Denies any sick contacts.  Patient is a 38 y.o. female presenting with headaches.  Headache Associated symptoms: congestion, dizziness, myalgias, nausea, photophobia, sinus pressure and weakness     History reviewed. No pertinent past medical history. Past Surgical History  Procedure Laterality Date  . Tubal ligation    . Tubal ligation     Family History  Problem Relation Age of Onset  . Hypertension Mother   . Ovarian cancer Sister   . Hypertension Sister    Social History  Substance Use Topics  . Smoking status: Former Games developer  . Smokeless tobacco: Never Used  . Alcohol Use: Yes     Comment: occasionally   OB History    No data available     Review of Systems  Constitutional: Positive for chills.  HENT: Positive for congestion, rhinorrhea and sinus pressure.   Eyes: Positive for photophobia.  Respiratory: Positive for shortness of breath.   Gastrointestinal: Positive for nausea.  Musculoskeletal:  Positive for myalgias.  Neurological: Positive for dizziness, weakness, light-headedness and headaches.      Allergies  Review of patient's allergies indicates no known allergies.  Home Medications   Prior to Admission medications   Medication Sig Start Date End Date Taking? Authorizing Provider  cyclobenzaprine (FLEXERIL) 5 MG tablet Take 5 mg by mouth 2 (two) times daily as needed for muscle spasms.    Historical Provider, MD  HYDROcodone-acetaminophen (NORCO/VICODIN) 5-325 MG per tablet Take 1-2 tablets by mouth every 6 (six) hours as needed for moderate pain.    Historical Provider, MD   BP 149/81 mmHg  Pulse 74  Temp(Src) 99.1 F (37.3 C) (Oral)  Resp 16  Ht  (1.702 m)  SpO2 100%  LMP 08/09/2015 (Within Days) Physical Exam  Constitutional: She is oriented to person, place, and time. She appears well-developed and well-nourished. No distress.  HENT:  Head: Normocephalic and atraumatic.  Right Ear: Tympanic membrane normal.  Left Ear: Tympanic membrane normal.  Nose: Right sinus exhibits frontal sinus tenderness. Right sinus exhibits no maxillary sinus tenderness. Left sinus exhibits frontal sinus tenderness. Left sinus exhibits no maxillary sinus tenderness.  Mouth/Throat: Uvula is midline, oropharynx is clear and moist and mucous membranes are normal. No oropharyngeal exudate.  Eyes: Conjunctivae and EOM are normal. Pupils are equal, round, and reactive to light. Right eye exhibits no discharge. Left eye exhibits no discharge. No scleral icterus.  Neck: Normal range of motion. Neck supple.  No nuchal rigidity  Cardiovascular: Normal rate, regular rhythm, normal heart sounds and intact distal pulses.   Pulmonary/Chest: Effort normal and breath sounds normal. No respiratory distress. She has no wheezes. She has no  rales. She exhibits no tenderness.  Abdominal: Soft. Bowel sounds are normal. She exhibits no distension and no mass. There is tenderness (mild diffuse  tenderness). There is no rigidity, no rebound, no guarding, no tenderness at McBurney's point and negative Murphy's sign.  Musculoskeletal: Normal range of motion. She exhibits no edema or tenderness.  Lymphadenopathy:    She has no cervical adenopathy.  Neurological: She is alert and oriented to person, place, and time. She has normal strength and normal reflexes. No cranial nerve deficit or sensory deficit. She displays a negative Romberg sign. Coordination and gait normal.  Skin: Skin is warm and dry. She is not diaphoretic.  Nursing note and vitals reviewed.   ED Course  Procedures (including critical care time) Labs Review Labs Reviewed - No data to display  Imaging Review No results found. I have personally reviewed and evaluated these images and lab results as part of my medical decision-making.  Filed Vitals:   08/24/15 1253  BP: 122/76  Pulse: 60  Temp: 98.3 F (36.8 C)  Resp: 14     MDM   Final diagnoses:  Acute nonintractable headache, unspecified headache type  Viral URI    Patient presents with headache, photophobia, chills, congestion, SOB. Denies fever. No relief with aspirin at home. Denies history of migraines. VSS. Exam revealed bilateral frontal sinus tenderness, mild diffuse abdominal tenderness, no peritoneal signs, no neuro deficits. Pt given migraine cocktail. Potassium 3.1, pt given PO potassium, remaining labs unremarkable. CXR negative. Pt reexamined and reports she if feeling, abdominal exam at that time revealed no tenderness. Pt able to tolerate PO. I do not suspect intracranial lesion, meningitis, encephalopathy or encephalitis and do not think that cranial imaging is needed at this time.  I suspect pt's sxs are likely due to viral URI/sinusitis. Plan to d/c pt home with decongestant. Patient given resource guide to follow up with PCP.  Evaluation does not show pathology requring ongoing emergent intervention or admission. Pt is hemodynamically stable  and mentating appropriately. Discussed findings/results and plan with patient/guardian, who agrees with plan. All questions answered. Return precautions discussed and outpatient follow up given.      Satira Sarkicole Elizabeth Science HillNadeau, [place] JerseyPA-C 08/24/15 1345  Rolan BuccoMelanie Belfi, MD 08/24/15 1438

## 2015-08-24 NOTE — Discharge Instructions (Signed)
Take your medications as prescribed. You may also take Ibuprofen as prescribed over the counter for pain relief. Please follow up with a primary care provider from the Resource Guide provided below in 4-5 days. Please return to the Emergency Department if symptoms worsen or new onset of fever, neck stiffness, visual changes, photophobia, abdominal pain, N/V, urinary symptoms, numbness, tingling, weakness, seizures, syncope.    Emergency Department Resource Guide 1) Find a Doctor and Pay Out of Pocket Although you won't have to find out who is covered by your insurance plan, it is a good idea to ask around and get recommendations. You will then need to call the office and see if the doctor you have chosen will accept you as a new patient and what types of options they offer for patients who are self-pay. Some doctors offer discounts or will set up payment plans for their patients who do not have insurance, but you will need to ask so you aren't surprised when you get to your appointment.  2) Contact Your Local Health Department Not all health departments have doctors that can see patients for sick visits, but many do, so it is worth a call to see if yours does. If you don't know where your local health department is, you can check in your phone book. The CDC also has a tool to help you locate your state's health department, and many state websites also have listings of all of their local health departments.  3) Find a Walk-in Clinic If your illness is not likely to be very severe or complicated, you may want to try a walk in clinic. These are popping up all over the country in pharmacies, drugstores, and shopping centers. They're usually staffed by nurse practitioners or physician assistants that have been trained to treat common illnesses and complaints. They're usually fairly quick and inexpensive. However, if you have serious medical issues or chronic medical problems, these are probably not your best  option.  No Primary Care Doctor: - Call Health Connect at  [phone] - they can help you locate a primary care doctor that  accepts your insurance, provides certain services, etc. - Physician Referral Service- [phone]  Chronic Pain Problems: Organization         Address  Phone   Notes  [facility]  [phone] Patients need to be referred by their primary care doctor.   Medication Assistance: Organization         Address  Phone   Notes  [facility] Medication [facility] [address] [place] [place], [place] [address] [phone] --Must be a resident of [facility] -- Must have NO insurance coverage whatsoever (no Medicaid/ Medicare, etc.) -- The pt. MUST have a primary care doctor that directs their care regularly and follows them in the community   Med[facility]ist  [phone]   [employer]  [phone]    Agencies that provide inexpensive medical care: Organization         Address  Phone   Notes  [facility]  [phone]   [facility]    [phone]   [facility] [address] [place], [place] [address] [phone]   [facility] [address] [place] [phone]   Planned Parenthood    [phone]   [facility]    [phone]   [facility]  [address], [place] Phone:  [phone], Fax:  [phone]) [phone] Hours of Operation:  9 am - 6 pm, M-F.  Also accepts Medicaid/Medicare and self-pay.  [facility]  [address], [place] Phone: [phone](336) [phone], Fax: [phone]) [phone]. Hours of Operation:  8:30 am - 5:30 pm, M-F.  Also accepts Medicaid and self-pay.  [facility] [place] [address], [place] Phone: [phone](336) [phone]   [facility] [address], [place], [place] [phone], Ext. [phone] Mondays & Thursdays: 7-9 AM.  First 15  patients are seen on a first come, first serve basis.    Medicaid-accepting [facility] Providers:  Organization         Address  Phone   Notes  [facility] [address], [place] [phone]) [phone] Also accepts self-pay patients.  [facility] [address], [place]  [phone]) [phone]   [facility] [address], [place] [phone]) [phone]   [facility] [address] [place] [address], [place] [phone]) [phone]   Renaye RakersVeita Bland [address], [place]   [phone] Only accepts [place] Access [place] patients after they have their name applied to their card.   Self-Pay (no insurance) in [facility]:  Organization         Address  Phone   Notes  Sickle Cell Patients, [facility] [address], [place] [phone]) [phone]   [facility] [address], [place] [phone]) [phone]   [facility]  [address], [place] [phone]) [phone]   [facility]/Dr. Osei-Bonsu  [address] [place] [address], [place] or [phone], [place] [phone]) [phone] Phone number for both [place] and [place] locations is the same.  [facility] [address], [place] [phone]) [phone]   [facility] [place] [address] [place] [address], [place] or [address] [phone]) [phone]   [facility] [address], [place] [phone](336) [phone], phone; [phone](336) [phone], fax Sees patients 1st and 3rd Saturday of every month.  Must not qualify for public or private insurance (i.e. Medicaid, Medicare, [place] Health Choice, Veterans' Benefits)  Household income should be no more than 200% of the poverty level The clinic cannot treat you if you are pregnant or think you are pregnant  Sexually transmitted diseases are not treated at the clinic.    Dental  Care: Organization         Address  Phone  Notes  [facility] [address], [place] [phone] Accepts children up to age 41 who are enrolled in [place] or [place] Health Choice; pregnant women with a Medicaid card; and children who have applied for Medicaid or [place] Health Choice, but were declined, whose parents can pay a reduced fee at time of service.  [facility] of [facility] [place]  [address], [place] [phone]) [phone] Accepts children up to age 41 who are enrolled in [place] or [place] Health Choice; pregnant women with a Medicaid card; and children who have applied for Medicaid or [place] Health Choice, but were declined, whose parents can pay a reduced fee at time of service.  [facility]  [address], [place] [phone]) [phone] Patients are seen  by appointment only. Walk-ins are not accepted. [facility] will see patients 85 years of age and older. Monday - Tuesday (8am-5pm) Most Wednesdays (8:30-5pm) $30 per visit, cash only  [facility] Adult Dental Access PROGRAM  [address], [facility] [phone] Patients are seen by appointment only. Walk-ins are not accepted. [facility] will see patients 5 years of age and older. One Wednesday Evening (Monthly: Volunteer Based).  $30 per visit, cash only  [employer]  [phone] for adults; Children under age 85, call Graduate Pediatric Dentistry at [phone]. Children aged 17-14, please call [phone] to request a pediatric application.  Dental services are provided in all areas of dental care including fillings, crowns and bridges, complete and partial dentures, implants, gum treatment, root canals, and extractions. Preventive care is also provided. Treatment is provided to both adults and children. Patients are selected via a lottery and there is often a waiting list.   [facility] [address] [place]  [phone] [email or web address]   [facility] [address] [place], [place] [phone], Ext. [phone] Second and Fourth Thursday of each month, opens at 6:30 AM; Clinic ends at 9 AM.  Patients are seen on a first-come first-served basis, and a limited number are seen during each clinic.   [facility]  [address] [place], [place] [phone]   Eligibility Requirements You must have lived in [place], [place], or [place] counties for at least the last three months.   You cannot be eligible for state or federal sponsored [place], including [employer], [place], or [employer].   You generally cannot be eligible for healthcare insurance through your employer.    How to apply: Eligibility screenings are held every Tuesday and Wednesday afternoon from 1:00 pm until 4:00 pm. You do not need an appointment for the interview!  [facility] [address] [place], [place] [phone]   [facility]  [phone]   [facility] Health Department  [phone]   [facility] Health Department  [phone]    Behavioral Health Resources in the Community: Intensive Outpatient Programs Organization         Address  Phone  Notes  [facility] [address] [place], [place] [phone]   [facility] [address] [place], [place] [phone]   [facility] [address], [place], [place]  [phone]   [facility] [address]  [place], [place] [phone] or [phone]   Substance Abuse Resources Organization         Address  Phone  Notes  [facility] Drug [facility]  [phone]   [facility]  [phone]   [facility] [place]  [phone]   Floydene Flock  [phone]   Residential & Outpatient Substance Abuse Program  [phone]    Psychological Services Organization         Address  Phone  Notes  [facility] Health  [phone]   [facility] Services  [phone]   [facility] [address] [place] [phone] or [phone]    Mobile Crisis Teams Organization         Address  Phone  Notes  [facility], Mobile Crisis Care Unit  [phone]   [facility]  [address] [place], [place] [phone]   [facility] [address] [place]  [phone]    Self-Help/Support Groups Organization         Address  Phone             Notes  [facility] of [place] - variety of support groups  [phone]- [phone] Call for more information  Narcotics Anonymous (NA), [facility] [address], [place]  2 meetings at this location   Statisticianesidential Treatment Programs Organization         Address  Phone  Notes  [facility] [address],    [place]  [phone]   [facility]  [address], [place] [phone], [place], [place] [phone]   [facility] [address], [place] [phone] Admissions: 8am-3pm M-F  [facility] [address]    [place], [place] [phone]   [facility] [address], [place], [place] [phone]   [facility] [address]  [place], [place] [phone]   [facility] - Intensive Outpatient [address], [place], [place] [phone]   [facility] ([facility] [address]  [place], [place] [phone] or [phone]   [facility] ([facility]) [address] [place], [place] [phone] Accepts Medicaid  [facility] [address]  [place] [phone] Substance Abuse/Addiction Treatment   [facility] Behavioral Health Resources Organization         Address  Phone  Notes  [facility]  [phone]) [phone]   Angie FavaJulie Brannon, PhD [address], [address] [place], [place]   [phone]) [phone] or [phone](336) [phone]   [facility]   [address] [place], [place] [phone](336) [phone]   [facility] [address], [place], [place] [phone](336) [phone] Insurance/Medicaid/sponsorship through [facility]  [facility] [address]                                    [place], [place] [phone](336) [phone] Therapy/tele-psych/case  [facility] [address].   [place], [place] [phone](336) [phone]    Dr. Lolly MustacheArfeen  [phone](336) [phone]   [facility]  [employer] [facility] 1) [address], [place] 2) [address], [place] 3)  [address], [place] [phone](336) [phone](336) [phone]  [phone](336) [phone]   [facility] Child Abuse Hotline [phone](336) [phone] or [phone](336) [phone] (After Hours)

## 2015-08-24 NOTE — ED Notes (Signed)
Pt reports headache, hot/cold, feeling "weak" since Friday- took aspirin yesterday without relief- temp 99.1 at this time

## 2015-08-30 ENCOUNTER — Emergency Department (HOSPITAL_BASED_OUTPATIENT_CLINIC_OR_DEPARTMENT_OTHER)
Admission: EM | Admit: 2015-08-30 | Discharge: 2015-08-30 | Disposition: A | Discharge status: Home / Self Care | Payer: 59 | Attending: Emergency Medicine | Admitting: Emergency Medicine

## 2015-08-30 ENCOUNTER — Emergency Department (HOSPITAL_BASED_OUTPATIENT_CLINIC_OR_DEPARTMENT_OTHER): Payer: 59

## 2015-08-30 ENCOUNTER — Encounter (HOSPITAL_BASED_OUTPATIENT_CLINIC_OR_DEPARTMENT_OTHER): Payer: Self-pay | Admitting: *Deleted

## 2015-08-30 DIAGNOSIS — R202 Paresthesia of skin: Secondary | ICD-10-CM | POA: Insufficient documentation

## 2015-08-30 DIAGNOSIS — Z87891 Personal history of nicotine dependence: Secondary | ICD-10-CM | POA: Insufficient documentation

## 2015-08-30 DIAGNOSIS — Z3202 Encounter for pregnancy test, result negative: Secondary | ICD-10-CM | POA: Insufficient documentation

## 2015-08-30 DIAGNOSIS — R51 Headache: Secondary | ICD-10-CM | POA: Diagnosis present

## 2015-08-30 DIAGNOSIS — Z79899 Other long term (current) drug therapy: Secondary | ICD-10-CM | POA: Insufficient documentation

## 2015-08-30 DIAGNOSIS — R519 Headache, unspecified: Secondary | ICD-10-CM

## 2015-08-30 LAB — CBC WITH DIFFERENTIAL/PLATELET
BASOS PCT: 0 %
Basophils Absolute: 0 10*3/uL (ref 0.0–0.1)
EOS ABS: 0 10*3/uL (ref 0.0–0.7)
Eosinophils Relative: 1 %
HEMATOCRIT: 36.4 % (ref 36.0–46.0)
Hemoglobin: 11.7 g/dL — ABNORMAL LOW (ref 12.0–15.0)
Lymphocytes Relative: 47 %
Lymphs Abs: 2.9 10*3/uL (ref 0.7–4.0)
MCH: 25.8 pg — ABNORMAL LOW (ref 26.0–34.0)
MCHC: 32.1 g/dL (ref 30.0–36.0)
MCV: 80.4 fL (ref 78.0–100.0)
MONO ABS: 0.3 10*3/uL (ref 0.1–1.0)
MONOS PCT: 5 %
Neutro Abs: 2.8 10*3/uL (ref 1.7–7.7)
Neutrophils Relative %: 47 %
Platelets: 303 10*3/uL (ref 150–400)
RBC: 4.53 MIL/uL (ref 3.87–5.11)
RDW: 16.2 % — AB (ref 11.5–15.5)
WBC: 6 10*3/uL (ref 4.0–10.5)

## 2015-08-30 LAB — BASIC METABOLIC PANEL
Anion gap: 7 (ref 5–15)
BUN: 13 mg/dL (ref 6–20)
CALCIUM: 9.7 mg/dL (ref 8.9–10.3)
CO2: 27 mmol/L (ref 22–32)
CREATININE: 0.66 mg/dL (ref 0.44–1.00)
Chloride: 106 mmol/L (ref 101–111)
GFR calc non Af Amer: >60 mL/min (ref >60)
Glucose, Bld: 84 mg/dL (ref 65–99)
Potassium: 3.3 mmol/L — ABNORMAL LOW (ref 3.5–5.1)
SODIUM: 140 mmol/L (ref 135–145)

## 2015-08-30 LAB — PREGNANCY, URINE: Preg Test, Ur: NEGATIVE

## 2015-08-30 MED ORDER — PROCHLORPERAZINE MALEATE 10 MG PO TABS: 10.0000 mg | ORAL_TABLET | Freq: Three times a day (TID) | ORAL | Status: DC | PRN

## 2015-08-30 MED ORDER — BUTALBITAL-APAP-CAFFEINE 50-325-40 MG PO TABS: 1.0000 | ORAL_TABLET | Freq: Four times a day (QID) | ORAL | Status: DC | PRN

## 2015-08-30 MED ORDER — SODIUM CHLORIDE 0.9 % IV BOLUS (SEPSIS)
1000.0000 mL | Freq: Once | INTRAVENOUS | Status: AC
  Administered 2015-08-30: 1000 mL via INTRAVENOUS

## 2015-08-30 MED ORDER — MAGNESIUM SULFATE 2 GM/50ML IV SOLN
2.0000 g | Freq: Once | INTRAVENOUS | Status: AC
  Administered 2015-08-30: 2 g via INTRAVENOUS
  Filled 2015-08-30: qty 50

## 2015-08-30 MED ORDER — PROCHLORPERAZINE EDISYLATE 5 MG/ML IJ SOLN
10.0000 mg | Freq: Once | INTRAMUSCULAR | Status: AC
  Administered 2015-08-30: 10 mg via INTRAVENOUS
  Filled 2015-08-30: qty 2

## 2015-08-30 MED ORDER — POTASSIUM CHLORIDE CRYS ER 20 MEQ PO TBCR: 20.0000 meq | EXTENDED_RELEASE_TABLET | Freq: Every day | ORAL | Status: DC

## 2015-08-30 NOTE — ED Notes (Signed)
Pt reports headache since 11/4. Pt has been eval in ED for same. States headache is getting worse and never goes away. Denies vomiting

## 2015-08-30 NOTE — ED Provider Notes (Signed)
CSN: [id number]     Arrival date & time 08/30/15  1059 History   First MD Initiated Contact with Patient 08/30/15 1201     Chief Complaint  Patient presents with  . Headache     (Consider location/radiation/quality/duration/timing/severity/associated sxs/prior Treatment) HPI   Blood pressure 148/97, pulse 60, temperature 99 F (37.2 C), temperature source Oral, resp. rate 18, height 5\' 7"  (1.702 m), weight 147 lb (66.679 kg), last menstrual period 08/24/2015, SpO2 100 %.  Stacie Stone is a 39 y.o. female complaining of throbbing 10/10 frontal occipital and facial pain onset 9 days ago. Patient states it reached maximal intensity within 20 minutes, states it's exacerbated by exertion and Valsalva. Patient has had headaches in the past but never this severe never ones that didn't go away to with over-the-counter medication. Patient was seen for this headache several days ago, she's been taking decongestants and Aleve at home with no relief. Headache has been constant and she has a bilateral lower facial pain that comes and goes. Patient denies fever, rash, confusion, cervicalgia, loss of consciousness/syncope, change in her vision, photosensitivity, photosensitivity, nausea vomiting weakness dysarthria ataxia exacerbation the morning, chest pain, shortness of breath, abdominal pain. Patient states that she's had spots in her vision for several months, these are unchanged since the headache. She also states that she has bilateral finger paresthesia for weeks, this occurred before the onset of the headache. Patient does not have a primary care physician. She has never seen a neurologist.   History reviewed. No pertinent past medical history. Past Surgical History  Procedure Laterality Date  . Tubal ligation    . Tubal ligation     Family History  Problem Relation Age of Onset  . Hypertension Mother   . Ovarian cancer Sister   . Hypertension Sister    Social History  Substance Use Topics    . Smoking status: Former Games developermoker  . Smokeless tobacco: Never Used  . Alcohol Use: Yes     Comment: occasionally   OB History    No data available     Review of Systems  10 systems reviewed and found to be negative, except as noted in the HPI.   Allergies  Review of patient's allergies indicates no known allergies.  Home Medications   Prior to Admission medications   Medication Sig Start Date End Date Taking? Authorizing Provider  butalbital-acetaminophen-caffeine (FIORICET) 50-325-40 MG tablet Take 1 tablet by mouth every 6 (six) hours as needed for headache. 08/30/15   Quadarius Henton, PA-C  cyclobenzaprine (FLEXERIL) 5 MG tablet Take 5 mg by mouth 2 (two) times daily as needed for muscle spasms.    Historical Provider, MD  HYDROcodone-acetaminophen (NORCO/VICODIN) 5-325 MG per tablet Take 1-2 tablets by mouth every 6 (six) hours as needed for moderate pain.    Historical Provider, MD  potassium chloride SA (K-DUR,KLOR-CON) 20 MEQ tablet Take 1 tablet (20 mEq total) by mouth daily. 08/30/15   Alishah Schulte, PA-C  prochlorperazine (COMPAZINE) 10 MG tablet Take 1 tablet (10 mg total) by mouth every 8 (eight) hours as needed (Headache). 08/30/15   Antawan Mchugh, PA-C  pseudoephedrine (SUDAFED) 60 MG tablet Take 1 tablet (60 mg total) by mouth every 6 (six) hours as needed for congestion. 08/24/15   Barrett HenleNicole Elizabeth Nadeau, PA-C   BP 153/88 mmHg  Pulse 55  Temp(Src) 99 F (37.2 C) (Oral)  Resp 18  Ht 5\' 7"  (1.702 m)  Wt 147 lb (66.679 kg)  BMI 23.02 kg/m2  SpO2 100%  LMP 08/24/2015 Physical Exam  Constitutional: She is oriented to person, place, and time. She appears well-developed and well-nourished.  HENT:  Head: Normocephalic and atraumatic.  Mouth/Throat: Oropharynx is clear and moist.  No drooling or stridor. Posterior pharynx mildly erythematous no significant tonsillar hypertrophy. No exudate. Soft palate rises symmetrically. No TTP or induration under tongue.    ++ TTP of bilateral  frontal and maxillary sinuses.  No mucosal edema in the nares.  Bilateral tympanic membranes with normal architecture and good light reflex.    Eyes: Conjunctivae and EOM are normal. Pupils are equal, round, and reactive to light.  No TTP or induration of temporal arteries bilaterally  Neck: Normal range of motion. Neck supple.  FROM to C-spine. Pt can touch chin to chest without discomfort. No TTP of midline cervical spine.   Cardiovascular: Normal rate, regular rhythm and intact distal pulses.   Pulmonary/Chest: Effort normal and breath sounds normal. No respiratory distress. She has no wheezes. She has no rales. She exhibits no tenderness.  Abdominal: Soft. Bowel sounds are normal. There is no tenderness.  Musculoskeletal: Normal range of motion. She exhibits no edema or tenderness.  Neurological: She is alert and oriented to person, place, and time. No cranial nerve deficit.  II-Visual fields grossly intact. III/IV/VI-Extraocular movements intact.  Pupils reactive bilaterally. V/VII-Smile symmetric, equal eyebrow raise,  facial sensation intact VIII- Hearing grossly intact IX/X-Normal gag XI-bilateral shoulder shrug XII-midline tongue extension Motor: 5/5 bilaterally with normal tone and bulk Cerebellar: Normal finger-to-nose  and normal heel-to-shin test.   Romberg negative Ambulates with a coordinated gait   Skin:     Nursing note and vitals reviewed.   ED Course  Procedures (including critical care time) Labs Review Labs Reviewed  CBC WITH DIFFERENTIAL/PLATELET - Abnormal; Notable for the following:    Hemoglobin 11.7 (*)    MCH 25.8 (*)    RDW 16.2 (*)    All other components within normal limits  BASIC METABOLIC PANEL - Abnormal; Notable for the following:    Potassium 3.3 (*)    All other components within normal limits  PREGNANCY, URINE    Imaging Review Ct Head Wo Contrast  08/30/2015  CLINICAL DATA:  Patient complains of  frontal area headache and occipital headache for 3 weeks. EXAM: CT HEAD WITHOUT CONTRAST TECHNIQUE: Contiguous axial images were obtained from the base of the skull through the vertex without intravenous contrast. COMPARISON:  12/27/2010 FINDINGS: The ventricles are normal in size and configuration. There are no parenchymal masses or mass effect. There is no evidence of an infarct. No extra-axial masses or abnormal fluid collections. There is no intracranial hemorrhage. The visualized sinuses and mastoid air cells are clear. No skull lesion. IMPRESSION: Normal unenhanced CT scan of the brain. Electronically Signed   By: Amie Portland M.D.   On: 08/30/2015 12:26   I have personally reviewed and evaluated these images and lab results as part of my medical decision-making.   EKG Interpretation None      MDM   Final diagnoses:  Acute nonintractable headache, unspecified headache type    Filed Vitals:   08/30/15 1109 08/30/15 1437 08/30/15 1613  BP: 148/97 134/89 153/88  Pulse: 60 58 55  Temp: 99 F (37.2 C)    TempSrc: Oral    Resp: Height:  (1.702 m)    Weight: 147 lb (66.679 kg)    SpO2: 100% 100% 100%    Medications  sodium chloride  0.9 % bolus 1,000 mL (0 mLs Intravenous Stopped 08/30/15 1620)  magnesium sulfate IVPB 2 g 50 mL (0 g Intravenous Stopped 08/30/15 1435)  prochlorperazine (COMPAZINE) injection 10 mg (10 mg Intravenous Given 08/30/15 1318)    Stacie Stone is 39 y.o. female presenting with headache which she's had for 9 days. This is in the frontal and occipital area, patient rates it at 9 out of 10. Today my neuro exam is nonfocal. CT negative. Patient will be given a headache cocktail, discussed the pros and cons of obtaining CSF to rule out subarachnoid.  2:30 PM: Patient seen and evaluated the bedside, states that her headache has improved to 8 out of 10, the right eye is tearing. Will give 2 L via nasal cannula for possible cluster headache.  When  to evaluate this patient at 2:38 PM: Patient states her headache is now fully resolved. Patient never received the oxygen supplementation.  In a shared visit with the attending physician we have discussed the pros and cons of obtaining LP and in shared decision-making with this patient she has chosen to not undergo LP. Stressed the importance of following up with neurology and primary care. Extensive discussion of return precautions.  Evaluation does not show pathology that would require ongoing emergent intervention or inpatient treatment. Pt is hemodynamically stable and mentating appropriately. Discussed findings and plan with patient/guardian, who agrees with care plan. All questions answered. Return precautions discussed and outpatient follow up given.   Discharge Medication List as of 08/30/2015  4:12 PM    START taking these medications   Details  butalbital-acetaminophen-caffeine (FIORICET) 50-325-40 MG tablet Take 1 tablet by mouth every 6 (six) hours as needed for headache., Starting 08/30/2015, Until Discontinued, Print    potassium chloride SA (K-DUR,KLOR-CON) 20 MEQ tablet Take 1 tablet (20 mEq total) by mouth daily., Starting 08/30/2015, Until Discontinued, Print    prochlorperazine (COMPAZINE) 10 MG tablet Take 1 tablet (10 mg total) by mouth every 8 (eight) hours as needed (Headache)., Starting 08/30/2015, Until Discontinued, Print             Wynetta Emery, PA-C 08/30/15 1638  Leta Baptist, MD 08/30/15 2139

## 2015-08-30 NOTE — Discharge Instructions (Signed)
Please follow with your primary care doctor in the next 2 days for a check-up. They must obtain records for further management.   Do not hesitate to return to the Emergency Department for any new, worsening or concerning symptoms.    General Headache Without Cause A headache is pain or discomfort felt around the head or neck area. There are many causes and types of headaches. In some cases, the cause may not be found.  HOME CARE  Managing Pain  Take over-the-counter and prescription medicines only as told by your doctor.  Lie down in a dark, quiet room when you have a headache.  If directed, apply ice to the head and neck area:  Put ice in a plastic bag.  Place a towel between your skin and the bag.  Leave the ice on for 20 minutes, 2-3 times per day.  Use a heating pad or hot shower to apply heat to the head and neck area as told by your doctor.  Keep lights dim if bright lights bother you or make your headaches worse. Eating and Drinking  Eat meals on a regular schedule.  Lessen how much alcohol you drink.  Lessen how much caffeine you drink, or stop drinking caffeine. General Instructions  Keep all follow-up visits as told by your doctor. This is important.  Keep a journal to find out if certain things bring on headaches. For example, write down:  What you eat and drink.  How much sleep you get.  Any change to your diet or medicines.  Relax by getting a massage or doing other relaxing activities.  Lessen stress.  Sit up straight. Do not tighten (tense) your muscles.  Do not use tobacco products. This includes cigarettes, chewing tobacco, or e-cigarettes. If you need help quitting, ask your doctor.  Exercise regularly as told by your doctor.  Get enough sleep. This often means 7-9 hours of sleep. GET HELP IF:  Your symptoms are not helped by medicine.  You have a headache that feels different than the other headaches.  You feel sick to your stomach  (nauseous) or you throw up (vomit).  You have a fever. GET HELP RIGHT AWAY IF:   Your headache becomes really bad.  You keep throwing up.  You have a stiff neck.  You have trouble seeing.  You have trouble speaking.  You have pain in the eye or ear.  Your muscles are weak or you lose muscle control.  You lose your balance or have trouble walking.  You feel like you will pass out (faint) or you pass out.  You have confusion.   This information is not intended to replace advice given to you by your health care provider. Make sure you discuss any questions you have with your health care provider.   Document Released: 07/12/2008 Document Revised: 06/24/2015 Document Reviewed: 01/26/2015 Elsevier Interactive Patient Education [employer]  . Emergency Department Resource Guide 1) Find a Doctor and Pay Out of Pocket Although you won't have to find out who is covered by your insurance plan, it is a good idea to ask around and get recommendations. You will then need to call the office and see if the doctor you have chosen will accept you as a new patient and what types of options they offer for patients who are self-pay. Some doctors offer discounts or will set up payment plans for their patients who do not have insurance, but you will need to ask so you aren't surprised  when you get to your appointment.  2) Contact Your Local Health Department Not all health departments have doctors that can see patients for sick visits, but many do, so it is worth a call to see if yours does. If you don't know where your local health department is, you can check in your phone book. The CDC also has a tool to help you locate your state's health department, and many state websites also have listings of all of their local health departments.  3) Find a Walk-in Clinic If your illness is not likely to be very severe or complicated, you may want to try a walk in clinic. These are popping up all over  the country in pharmacies, drugstores, and shopping centers. They're usually staffed by nurse practitioners or physician assistants that have been trained to treat common illnesses and complaints. They're usually fairly quick and inexpensive. However, if you have serious medical issues or chronic medical problems, these are probably not your best option.  No Primary Care Doctor: - Call Health Connect at  [phone] - they can help you locate a primary care doctor that  accepts your insurance, provides certain services, etc. - Physician Referral Service- [phone]  Chronic Pain Problems: Organization         Address  Phone   Notes  [facility]  [phone] Patients need to be referred by their primary care doctor.   Medication Assistance: Organization         Address  Phone   Notes  [facility] [address] [place], [place] [address] [phone] --Must be a resident of [facility] [place] -- Must have NO insurance coverage whatsoever (no Medicaid/ Medicare, etc.) -- The pt. MUST have a primary care doctor that directs their care regularly and follows them in the community   MedAssist  [phone]   [employer]  [phone]    Agencies that provide inexpensive medical care: Organization         Address  Phone   Notes  [facility]  [phone]   [facility]    [phone]   [facility] [address] [place], [place] [address] [phone]   [facility] [address], [place] [phone]   Planned Parenthood    [phone]   [facility]    [phone]   [facility]  [address], [place] Phone:  [phone], Fax:  [phone] Hours of Operation:  9 am - 6 pm, M-F.  Also accepts Medicaid/Medicare and self-pay.  [facility]  [address]  [address], [place] Phone: [phone]) [phone], Fax: [phone]) [phone]. Hours of Operation:  8:30 am - 5:30 pm, M-F.  Also accepts Medicaid and self-pay.  [facility] [place] [address], [place] Phone: [phone]   [facility] [address], [place], [place] [phone], Ext. [phone] Mondays & Thursdays: 7-9 AM.  First 15 patients are seen on a first come, first serve basis.    Medicaid-accepting [facility] Providers:  Organization         Address  Phone   Notes  [facility] [address], [place] [phone] Also accepts self-pay patients.  [facility] [address] [place], [place]  [phone]   [facility] [address] [place], [address], [place] [phone]   [facility] [address], [place] [phone]   Renaye Rakers [address], [place]   [phone] Only accepts [place] Access [place] patients after they have their name applied to their card.   Self-Pay (no insurance) in [employer]:  Organization         Address  Phone   Notes  Sickle Cell Patients, [facility] [address] [place], [place] [phone]   [facility] [address] [place], [place] [phone]   [facility] [place]  [address], [place] [phone]   [facility]/Dr. Osei-Bonsu  [address], [place] or [address], [place] [phone] Phone number for both [place] and [place] locations is the same.  [facility]gent Medical [facility] [address], [place] [phone]   [facility] [address] [place] or [address] [phone]   [facility] [address], [place] [phone], phone; [phone], fax Sees patients 1st and 3rd Saturday of every month.   Must not qualify for public or private insurance (i.e. Medicaid, Medicare, [place] Health Choice, Veterans' Benefits)  Household income should be no more than 200% of the poverty level The clinic cannot treat you if you are pregnant or think you are pregnant  Sexually transmitted diseases are not treated at the clinic.    Dental Care: Organization         Address  Phone  Notes  [facility] Department of [facility] [address] [place], [place] [phone] Accepts children up to age 63 who are enrolled in [place] or [place] Health Choice; pregnant women with a Medicaid card; and children who have applied for Medicaid or [place] Health Choice, but were declined, whose parents can pay a reduced fee at time of service.  [facility]  [address], [place] [phone] Accepts children up to age 15 who are enrolled in [place] or [place] Health Choice; pregnant women with a Medicaid card; and children who have applied for Medicaid or [place] Health Choice, but were declined, whose parents can pay a reduced fee at time of service.  [facility]  [address] [place], [place] [phone] Patients are seen by appointment only. Walk-ins are not accepted. [facility] will see patients 52 years of age and older. Monday - Tuesday (8am-5pm) Most Wednesdays (8:30-5pm) $30 per visit, cash only  [facility] Access PRO[facility]  [address], [facility] [phone] Patients are seen by appointment only. Walk-ins are not accepted. [facility] will see patients 46 years of age and older. One Wednesday Evening (Monthly: Volunteer Based).  $30 per visit, cash only  [employer]  [phone] for adults; Children under age 33, call Graduate Pediatric Dentistry at [phone]. Children aged 30-14, please call [phone] to request a pediatric application.  Dental services are  provided in all areas of dental care including fillings, crowns and bridges, complete and partial dentures, implants, gum treatment, root canals, and extractions. Preventive care is also provided. Treatment is provided to both adults and children. Patients are selected via a lottery and there is often a waiting list.  [facility] [address] [place]  [phone] [email or web address]   [facility] [address] [place], [place] [phone], Ext. [phone] Second and Fourth Thursday of each month, opens at 6:30 AM; Clinic ends at 9 AM.  Patients are seen on a first-come first-served basis, and a limited number are seen during each clinic.   [facility]  [address] Hillard Danker [place], [place] [phone]   Eligibility Requirements You must have lived in [place], [place], or [place] counties for at least the last three months.   You cannot be eligible for state or federal sponsored [employer], including [employer], [place], or [employer].   You generally cannot be eligible for healthcare insurance through your employer.    How to apply: Eligibility screenings are held every Tuesday and Wednesday afternoon from 1:00 pm until 4:00 pm. You do not need an appointment for the interview!  [facility] [address], [place], [place]   [place]  [place] Department  [place]  [phone]    Behavioral Health Resources in the Community: Intensive Outpatient Programs Organization         Address  Phone  Notes  [place]. [address] [place], [place] [phone]   [facility] Outpatient [address] [place], [place]   ADS: Alcohol & Drug Svcs [address] [place], [place]   [place] [address]  [place], [place] or [phone]   Substance Abuse Resources Organization         Address  Phone  Notes  Alcohol and Drug Services  [phone]   [place]  [phone]   The [place]   Chinita Pester  [phone]   Residential & Outpatient Substance Abuse Program  [phone]   Psychological Services Organization         Address  Phone  Notes  [facility] [place]  [place]  [phone]   [place] [address], [place] or [phone]    Mobile Crisis Teams Organization         Address  Phone  Notes  [facility], Mobile Crisis Care Unit  [phone]   [facility]  [address] [place], [place]   [place] Levels [address] [place] [phone]    Self-Help/Support Groups Organization         Address  Phone             Notes  [place] of [place] - variety of support groups  [place] Call for more information  Narcotics Anonymous (NA), [facility] [address], [employer] [place]  2 meetings at this location   Special educational needs teacher         Address  Phone  Notes  ASAP Residential Treatment [place],    [employer]  [phone]   [facility]  [address], [place] [address], [place], [place]   [place], [place] [phone] Admissions: 8am-3pm M-F  Incentives Substance [place] [place] [address]    [place], [place] [phone]   [facility] [address] [place], [place], [place]   The [place].,  [place], [place]   Insight Programs - Intensive Outpatient [address], [place], [place]   [facility] ([place] [address]  [place], [place] [phone] or  [phone]   [facility] (RTS) [address] [place], [place] Accepts Medicaid  [facility] [place] [address],  [place] [phone] Substance Abuse/Addiction Treatment   [facility] Organization         Address  Phone  Notes  [facility]  [phone]   Domenic Schwab, PhD [address] Arlis Porta [place], [place]   [phone] or [phone]   [place], [place] [phone]   [facility] [address], [place], [place] [phone] Insurance/Medicaid/sponsorship through [facility] and Families [address] [place]                                    [place], [place] [phone] [place] [address], [place] [phone]    Dr. Adele Schilder  [phone]   [facility]. 1) [address] [place] 2) [place] 3)  [address], [place] [phone]  [phone]   [place] [phone] or [phone] (After Hours)

## 2017-08-09 ENCOUNTER — Emergency Department (HOSPITAL_BASED_OUTPATIENT_CLINIC_OR_DEPARTMENT_OTHER): Payer: Self-pay

## 2017-08-09 ENCOUNTER — Emergency Department (HOSPITAL_BASED_OUTPATIENT_CLINIC_OR_DEPARTMENT_OTHER)
Admission: EM | Admit: 2017-08-09 | Discharge: 2017-08-09 | Disposition: A | Discharge status: Home / Self Care | Payer: Self-pay | Attending: Emergency Medicine | Admitting: Emergency Medicine

## 2017-08-09 ENCOUNTER — Encounter (HOSPITAL_BASED_OUTPATIENT_CLINIC_OR_DEPARTMENT_OTHER): Payer: Self-pay

## 2017-08-09 DIAGNOSIS — M5442 Lumbago with sciatica, left side: Secondary | ICD-10-CM | POA: Insufficient documentation

## 2017-08-09 DIAGNOSIS — R1013 Epigastric pain: Secondary | ICD-10-CM

## 2017-08-09 DIAGNOSIS — K5900 Constipation, unspecified: Secondary | ICD-10-CM

## 2017-08-09 DIAGNOSIS — M5441 Lumbago with sciatica, right side: Secondary | ICD-10-CM

## 2017-08-09 LAB — COMPREHENSIVE METABOLIC PANEL
ALBUMIN: 4.2 g/dL (ref 3.5–5.0)
ALT: 14 U/L (ref 14–54)
ANION GAP: 6 (ref 5–15)
AST: 18 U/L (ref 15–41)
Alkaline Phosphatase: 55 U/L (ref 38–126)
BUN: 7 mg/dL (ref 6–20)
CO2: 25 mmol/L (ref 22–32)
Calcium: 9.8 mg/dL (ref 8.9–10.3)
Chloride: 106 mmol/L (ref 101–111)
Creatinine, Ser: 0.72 mg/dL (ref 0.44–1.00)
GFR calc non Af Amer: >60 mL/min (ref >60)
GLUCOSE: 98 mg/dL (ref 65–99)
POTASSIUM: 3.3 mmol/L — AB (ref 3.5–5.1)
SODIUM: 137 mmol/L (ref 135–145)
Total Bilirubin: 0.7 mg/dL (ref 0.3–1.2)
Total Protein: 8.1 g/dL (ref 6.5–8.1)

## 2017-08-09 LAB — CBC WITH DIFFERENTIAL/PLATELET
BASOS PCT: 1 %
Basophils Absolute: 0 10*3/uL (ref 0.0–0.1)
EOS ABS: 0.1 10*3/uL (ref 0.0–0.7)
Eosinophils Relative: 2 %
HCT: 36.3 % (ref 36.0–46.0)
Hemoglobin: 12 g/dL (ref 12.0–15.0)
LYMPHS PCT: 45 %
Lymphs Abs: 2.8 10*3/uL (ref 0.7–4.0)
MCH: 26.9 pg (ref 26.0–34.0)
MCHC: 33.1 g/dL (ref 30.0–36.0)
MCV: 81.4 fL (ref 78.0–100.0)
MONO ABS: 0.4 10*3/uL (ref 0.1–1.0)
Monocytes Relative: 7 %
NEUTROS ABS: 2.9 10*3/uL (ref 1.7–7.7)
Neutrophils Relative %: 47 %
Platelets: 231 10*3/uL (ref 150–400)
RBC: 4.46 MIL/uL (ref 3.87–5.11)
RDW: 14.9 % (ref 11.5–15.5)
WBC: 6.2 10*3/uL (ref 4.0–10.5)

## 2017-08-09 LAB — URINALYSIS, ROUTINE W REFLEX MICROSCOPIC
Bilirubin Urine: NEGATIVE
Glucose, UA: NEGATIVE mg/dL
Ketones, ur: NEGATIVE mg/dL
Leukocytes, UA: NEGATIVE
Nitrite: NEGATIVE
PROTEIN: NEGATIVE mg/dL
SPECIFIC GRAVITY, URINE: 1.025 (ref 1.005–1.030)
pH: 6 (ref 5.0–8.0)

## 2017-08-09 LAB — PREGNANCY, URINE: PREG TEST UR: NEGATIVE

## 2017-08-09 LAB — URINALYSIS, MICROSCOPIC (REFLEX)

## 2017-08-09 LAB — LIPASE, BLOOD: Lipase: 23 U/L (ref 11–51)

## 2017-08-09 MED ORDER — ONDANSETRON HCL 4 MG PO TABS: 4.0000 mg | ORAL_TABLET | Freq: Three times a day (TID) | ORAL | 0 refills | Status: DC | PRN

## 2017-08-09 MED ORDER — HYDROMORPHONE HCL 1 MG/ML IJ SOLN
1.0000 mg | Freq: Once | INTRAMUSCULAR | Status: AC
  Administered 2017-08-09: 1 mg via INTRAVENOUS
  Filled 2017-08-09: qty 1

## 2017-08-09 MED ORDER — OXYCODONE-ACETAMINOPHEN 5-325 MG PO TABS: 1.0000 | ORAL_TABLET | ORAL | 0 refills | Status: DC | PRN

## 2017-08-09 MED ORDER — CYCLOBENZAPRINE HCL 10 MG PO TABS: 10.0000 mg | ORAL_TABLET | Freq: Two times a day (BID) | ORAL | 0 refills | Status: DC | PRN

## 2017-08-09 MED ORDER — IOPAMIDOL (ISOVUE-300) INJECTION 61%
100.0000 mL | Freq: Once | INTRAVENOUS | Status: AC | PRN
  Administered 2017-08-09: 100 mL via INTRAVENOUS

## 2017-08-09 MED ORDER — MORPHINE SULFATE (PF) 4 MG/ML IV SOLN
4.0000 mg | Freq: Once | INTRAVENOUS | Status: AC
  Administered 2017-08-09: 4 mg via INTRAVENOUS
  Filled 2017-08-09: qty 1

## 2017-08-09 NOTE — ED Provider Notes (Signed)
[facility] EMERGENCY DEPARTMENT Provider Note   CSN: [id number] Arrival date & time: 08/09/17  1552     History   Chief Complaint Chief Complaint  Patient presents with  . Back Pain  . Abdominal Pain    HPI Stacie Stone is a 41 y.o. female.  The history is provided by the patient and medical records. No language interpreter was used.  Back Pain   This is a new problem. The current episode started 12 to 24 hours ago. The problem occurs constantly. The problem has not changed since onset.The pain is associated with no known injury. The pain is present in the lumbar spine and sacro-iliac joint. The quality of the pain is described as stabbing and shooting. The pain radiates to the right thigh and left thigh. The pain is at a severity of 10/10. The pain is severe. The symptoms are aggravated by bending and twisting. The pain is the same all the time. Associated symptoms include abdominal pain and abdominal swelling. Pertinent negatives include no chest pain, no fever, no numbness, no headaches, no perianal numbness, no bladder incontinence, no dysuria, no pelvic pain and no weakness. She has tried nothing for the symptoms. The treatment provided no relief. Risk factors include obesity.  Abdominal Pain   This is a recurrent problem. The current episode started more than 1 week ago. The problem occurs constantly. The problem has been gradually worsening. The pain is associated with an unknown factor. The pain is located in the epigastric region. The quality of the pain is aching and cramping. Associated symptoms include nausea. Pertinent negatives include fever, diarrhea, vomiting, constipation, dysuria, frequency, hematuria and headaches. The symptoms are aggravated by palpation. Nothing relieves the symptoms. Past workup does not include GI consult, CT scan or surgery.    History reviewed. No pertinent past medical history.  Patient Active Problem List   Diagnosis Date Noted    . Tonsillitis 01/15/2013  . Hypokalemia 01/15/2013    Past Surgical History:  Procedure Laterality Date  . TUBAL LIGATION    . TUBAL LIGATION      OB History    No data available       Home Medications    Prior to Admission medications   Not on File    Family History Family History  Problem Relation Age of Onset  . Hypertension Mother   . Ovarian cancer Sister   . Hypertension Sister     Social History Social History  Substance Use Topics  . Smoking status: Never Smoker  . Smokeless tobacco: Never Used  . Alcohol use Yes     Comment: occasionally     Allergies   Patient has no known allergies.   Review of Systems Review of Systems  Constitutional: Negative for chills, diaphoresis, fatigue and fever.  HENT: Negative for congestion.   Eyes: Negative for visual disturbance.  Respiratory: Negative for chest tightness, shortness of breath, wheezing and stridor.   Cardiovascular: Negative for chest pain.  Gastrointestinal: Positive for abdominal distention, abdominal pain and nausea. Negative for constipation, diarrhea and vomiting.  Genitourinary: Negative for bladder incontinence, dysuria, flank pain, frequency, hematuria and pelvic pain.  Musculoskeletal: Positive for back pain. Negative for neck pain and neck stiffness.  Skin: Negative for rash and wound.  Neurological: Negative for weakness, light-headedness, numbness and headaches.  Psychiatric/Behavioral: Negative for agitation.  All other systems reviewed and are negative.    Physical Exam Updated Vital Signs BP (!) 139/91 (BP Location: Left Arm)  Pulse 84   Temp 98.4 F (36.9 C) (Oral)   Resp 16   Ht 5\' 7"  (1.702 m)   Wt 70.3 kg (155 lb)   LMP 07/19/2017   SpO2 100%   BMI 24.28 kg/m   Physical Exam  Constitutional: She is oriented to person, place, and time. She appears well-developed and well-nourished. No distress.  HENT:  Mouth/Throat: Oropharynx is clear and moist. No  oropharyngeal exudate.  Eyes: Pupils are equal, round, and reactive to light. Conjunctivae and EOM are normal.  Neck: Normal range of motion.  Cardiovascular: Normal rate and intact distal pulses.   No murmur heard. Pulmonary/Chest: Effort normal. No stridor. She has no wheezes. She exhibits no tenderness.  Abdominal: She exhibits distension. There is tenderness in the epigastric area. There is no rigidity, no rebound, no guarding and no CVA tenderness. A hernia is present.    Musculoskeletal: She exhibits tenderness. She exhibits no edema.       Lumbar back: She exhibits tenderness and pain.       Back:  Neurological: She is oriented to person, place, and time. No cranial nerve deficit or sensory deficit. She exhibits normal muscle tone.  Skin: Capillary refill takes less than 2 seconds. No rash noted. She is not diaphoretic. No erythema.  Psychiatric: She has a normal mood and affect.  Nursing note and vitals reviewed.    ED Treatments / Results  Labs (all labs ordered are listed, but only abnormal results are displayed) Labs Reviewed  URINALYSIS, ROUTINE W REFLEX MICROSCOPIC - Abnormal; Notable for the following:       Result Value   Hgb urine dipstick SMALL (*)    All other components within normal limits  COMPREHENSIVE METABOLIC PANEL - Abnormal; Notable for the following:    Potassium 3.3 (*)    All other components within normal limits  URINALYSIS, MICROSCOPIC (REFLEX) - Abnormal; Notable for the following:    Bacteria, UA RARE (*)    Squamous Epithelial / LPF 6-30 (*)    All other components within normal limits  PREGNANCY, URINE  CBC WITH DIFFERENTIAL/PLATELET  LIPASE, BLOOD    EKG  EKG Interpretation None       Radiology Ct Abdomen Pelvis W Contrast  Result Date: 08/09/2017 CLINICAL DATA:  Left side abdominal pain and low back pain. EXAM: CT ABDOMEN AND PELVIS WITH CONTRAST TECHNIQUE: Multidetector CT imaging of the abdomen and pelvis was performed using  the standard protocol following bolus administration of intravenous contrast. CONTRAST:  100mL ISOVUE-300 IOPAMIDOL (ISOVUE-300) INJECTION 61% COMPARISON:  CT 02/27/2004 FINDINGS: Lower chest: Lung bases are clear. No effusions. Heart is normal size. Hepatobiliary: No focal hepatic abnormality. Gallbladder unremarkable. Pancreas: No focal abnormality or ductal dilatation. Spleen: No focal abnormality.  Normal size. Adrenals/Urinary Tract: No adrenal abnormality. No focal renal abnormality. No stones or hydronephrosis. Urinary bladder is unremarkable. Stomach/Bowel: Normal appendix. Moderate stool burden in the colon. Stomach, large and small bowel grossly unremarkable. Vascular/Lymphatic: No evidence of aneurysm or adenopathy. Reproductive: Heterogeneous enhancement throughout the uterus, likely related to fibroids. Small corpus luteal cyst or dominant follicle in the right ovary. No adnexal masses. Other: No free fluid or free air. Musculoskeletal: No acute bony abnormality. IMPRESSION: No acute findings in the abdomen or pelvis. Moderate stool burden in the colon. Possible uterine scratched at heterogeneous enhancement throughout the uterus, possibly related to fibroids. Normal appendix. Electronically Signed   By: Charlett NoseKevin  Dover M.D.   On: 08/09/2017 18:02    Procedures Procedures (including  critical care time)  Medications Ordered in ED Medications  morphine 4 MG/ML injection 4 mg (4 mg Intravenous Given 08/09/17 1653)  iopamidol (ISOVUE-300) 61 % injection 100 mL (100 mLs Intravenous Contrast Given 08/09/17 1746)  HYDROmorphone (DILAUDID) injection 1 mg (1 mg Intravenous Given 08/09/17 1819)     Initial Impression / Assessment and Plan / ED Course  I have reviewed the triage vital signs and the nursing notes.  Pertinent labs & imaging results that were available during my care of the patient were reviewed by me and considered in my medical decision making (see chart for details).     BRENLEY PRIORE is a 41 y.o. female with no significant past medical history who presents with abdominal pain and back pain.  Patient reports that she has had abdominal pain for approximately 1 year that has been worsening for the last week with an associated bulge she is concerned is a hernia.  Patient denies any previous diagnosis of hernia or any recent traumatic injuries.  She describes that she gets nausea and it is painful bulging.  She describes the pain as a 10 out of 10 in severity.  She says that she has had normal bowel movements and no urinary symptoms.  No vaginal discharge or vaginal bleeding.  She denies any chest pain or shortness of breath.  Patient second complaint is severe low back pain.  She says that she has no history of low back pain but today developed greater than 10 out of 10 low back pain that radiates down her bilateral legs posteriorly.  She denies any history of sciatic pain or prior back injuries.  She has no prior history of leg pain or leg weakness.  She denies any loss of bladder control.  On exam, patient has severe tenderness in the lumbar spine both in the midline and paraspinally.  Patient also has positive straight leg raise testing bilaterally.  Normal sensation and pulses in both lower extremities.  Normal strength in both legs.  Patient's abdomen is tender in the epigastrium where there is a swelling bulge.  Suspect herniation.  Patient had gentle pressure applied and the hernia was felt to be partially reducible.  Patient's lungs were otherwise clear and chest was nontender.  No other abnormal is on exam.   With the 1 week of worsening abdominal pain, there is concern for worsening hernia or incarceration.  As patient says the bulge reduces, I do not feel she has incarceration currently but with the severe tenderness 1 to evaluate for any other abnormalities.  With the severe new low back pain with bilateral sciatica pain and report of difficulty walking due to the pain,  patient will have imaging to also evaluate this area.   Patient was given pain medicine and nausea medicine and reevaluated after Diagnostic testing.  Patient's diagnostic testing results are seen above.  CT imaging showed no evidence of hernia.  Patient does have thin appearing abdominal wall that was likely contributing to the bulge.  No hernia.  Patient was however found to have moderate stool burden and evidence of fibroids.  Unclear etiology of the back pain but suspect a musculoskeletal type pain causing the sciatic symptoms.  With no neurological deficits, do not feel patient requires transfer for MRI at this time however patient will be given prescription for muscle relaxant and pain medicine and instructions to follow-up with a PCP.  Patient will also follow-up with OB/GYN for her possible fibroids.  Next  Patient  was feeling better after medications.  Patient understood return precautions.  Patient discharged in good condition with understanding of the plan of care and improving symptoms.    Final Clinical Impressions(s) / ED Diagnoses   Final diagnoses:  Acute bilateral low back pain with bilateral sciatica  Epigastric pain  Constipation, unspecified constipation type    New Prescriptions Discharge Medication List as of 08/09/2017  9:38 PM    START taking these medications   Details  cyclobenzaprine (FLEXERIL) 10 MG tablet Take 1 tablet (10 mg total) by mouth 2 (two) times daily as needed for muscle spasms., Starting Wed 08/09/2017, Print    ondansetron (ZOFRAN) 4 MG tablet Take 1 tablet (4 mg total) by mouth every 8 (eight) hours as needed., Starting Wed 08/09/2017, Print    oxyCODONE-acetaminophen (PERCOCET/ROXICET) 5-325 MG tablet Take 1 tablet by mouth every 4 (four) hours as needed for severe pain., Starting Wed 08/09/2017, Print        Clinical Impression: 1. Acute bilateral low back pain with bilateral sciatica   2. Epigastric pain   3. Constipation,  unspecified constipation type     Disposition: Discharge  Condition: Good  I have discussed the results, Dx and Tx plan with the pt(& family if present). He/she/they expressed understanding and agree(s) with the plan. Discharge instructions discussed at great length. Strict return precautions discussed and pt &/or family have verbalized understanding of the instructions. No further questions at time of discharge.    Discharge Medication List as of 08/09/2017  9:38 PM    START taking these medications   Details  cyclobenzaprine (FLEXERIL) 10 MG tablet Take 1 tablet (10 mg total) by mouth 2 (two) times daily as needed for muscle spasms., Starting Wed 08/09/2017, Print    ondansetron (ZOFRAN) 4 MG tablet Take 1 tablet (4 mg total) by mouth every 8 (eight) hours as needed., Starting Wed 08/09/2017, Print    oxyCODONE-acetaminophen (PERCOCET/ROXICET) 5-325 MG tablet Take 1 tablet by mouth every 4 (four) hours as needed for severe pain., Starting Wed 08/09/2017, Print        Follow Up: [facility] [address] [place] [address] [phone] Schedule an appointment as soon as possible for a visit    [facility] [address] [place] [address] [phone] Schedule an appointment as soon as possible for a visit    [facility] [place] [facility]PART[facility] [address] [phone] mc [address] [place] [address] [phone]  If symptoms worsen     Aiko Belko, Canary Brim, MD 08/09/17 2332

## 2017-08-09 NOTE — Discharge Instructions (Signed)
Please follow-up with your primary care physician and an OB/GYN for reassessment.  Your imaging today was reassuring and we did not see an acute cause of your symptoms.  Your exam was consistent with muscle spasm and sciatic pain which we are giving you medications to treat.  Please stay hydrated.  If any symptoms change or worsen, please return to the nearest emergency department.

## 2017-08-09 NOTE — ED Triage Notes (Addendum)
C/o lower back pain that radiates down both legs-denies injury-also c/o lower abd pain x 1 year-worse x 1 week-presents to triage in w/c-NAD

## 2018-03-13 ENCOUNTER — Emergency Department (HOSPITAL_BASED_OUTPATIENT_CLINIC_OR_DEPARTMENT_OTHER)
Admission: EM | Admit: 2018-03-13 | Discharge: 2018-03-13 | Disposition: A | Discharge status: Home / Self Care | Payer: Self-pay | Attending: Emergency Medicine | Admitting: Emergency Medicine

## 2018-03-13 ENCOUNTER — Other Ambulatory Visit: Payer: Self-pay

## 2018-03-13 ENCOUNTER — Encounter (HOSPITAL_BASED_OUTPATIENT_CLINIC_OR_DEPARTMENT_OTHER): Payer: Self-pay

## 2018-03-13 ENCOUNTER — Emergency Department (HOSPITAL_BASED_OUTPATIENT_CLINIC_OR_DEPARTMENT_OTHER): Payer: Self-pay

## 2018-03-13 DIAGNOSIS — Z79899 Other long term (current) drug therapy: Secondary | ICD-10-CM | POA: Insufficient documentation

## 2018-03-13 DIAGNOSIS — M7918 Myalgia, other site: Secondary | ICD-10-CM | POA: Insufficient documentation

## 2018-03-13 LAB — BASIC METABOLIC PANEL
Anion gap: 8 (ref 5–15)
BUN: 11 mg/dL (ref 6–20)
CHLORIDE: 107 mmol/L (ref 101–111)
CO2: 22 mmol/L (ref 22–32)
CREATININE: 0.7 mg/dL (ref 0.44–1.00)
Calcium: 9.5 mg/dL (ref 8.9–10.3)
GFR calc Af Amer: >60 mL/min (ref >60)
GFR calc non Af Amer: >60 mL/min (ref >60)
Glucose, Bld: 158 mg/dL — ABNORMAL HIGH (ref 65–99)
POTASSIUM: 3.4 mmol/L — AB (ref 3.5–5.1)
SODIUM: 137 mmol/L (ref 135–145)

## 2018-03-13 LAB — CBC
HCT: 33.7 % — ABNORMAL LOW (ref 36.0–46.0)
Hemoglobin: 11.4 g/dL — ABNORMAL LOW (ref 12.0–15.0)
MCH: 26.5 pg (ref 26.0–34.0)
MCHC: 33.8 g/dL (ref 30.0–36.0)
MCV: 78.4 fL (ref 78.0–100.0)
PLATELETS: 326 10*3/uL (ref 150–400)
RBC: 4.3 MIL/uL (ref 3.87–5.11)
RDW: 14.7 % (ref 11.5–15.5)
WBC: 7 10*3/uL (ref 4.0–10.5)

## 2018-03-13 LAB — TROPONIN I: Troponin I: <0.03 ng/mL (ref <0.03)

## 2018-03-13 MED ORDER — METHOCARBAMOL 500 MG PO TABS: 500.0000 mg | ORAL_TABLET | Freq: Three times a day (TID) | ORAL | 0 refills | Status: DC | PRN

## 2018-03-13 NOTE — ED Provider Notes (Signed)
[facility] EMERGENCY DEPARTMENT Provider Note   CSN: [id number] Arrival date & time: 03/13/18  1738     History   Chief Complaint Chief Complaint  Patient presents with  . Chest Pain    HPI Stacie Stone is a 42 y.o. female.  HPI Patient with left-sided chest pain.  Goes from left shoulder down to her arm.  Has come and gone during that time.  Worse today.  Does not come on with exertion.  No weakness.  Pain shoots down the arm.  Does not have any new activities.  No swelling in her legs.  No fevers or chills.  Pain is dull.  Worse with movements.  Had difficulty sleeping due to last night.  Took an oxycodone that she had for previous back pain states that it helps some. History reviewed. No pertinent past medical history.  Patient Active Problem List   Diagnosis Date Noted  . Tonsillitis 01/15/2013  . Hypokalemia 01/15/2013    Past Surgical History:  Procedure Laterality Date  . TUBAL LIGATION    . TUBAL LIGATION       OB History   None      Home Medications    Prior to Admission medications   Medication Sig Start Date End Date Taking? Authorizing Provider  cyclobenzaprine (FLEXERIL) 10 MG tablet Take 1 tablet (10 mg total) by mouth 2 (two) times daily as needed for muscle spasms. 08/09/17   Tegeler, Canary Brim, MD  methocarbamol (ROBAXIN) 500 MG tablet Take 1 tablet (500 mg total) by mouth every 8 (eight) hours as needed for muscle spasms. 03/13/18   Benjiman Core, MD  ondansetron (ZOFRAN) 4 MG tablet Take 1 tablet (4 mg total) by mouth every 8 (eight) hours as needed. 08/09/17   Tegeler, Canary Brim, MD  oxyCODONE-acetaminophen (PERCOCET/ROXICET) 5-325 MG tablet Take 1 tablet by mouth every 4 (four) hours as needed for severe pain. 08/09/17   Tegeler, Canary Brim, MD    Family History Family History  Problem Relation Age of Onset  . Hypertension Mother   . Ovarian cancer Sister   . Hypertension Sister     Social History Social  History   Tobacco Use  . Smoking status: Never Smoker  . Smokeless tobacco: Never Used  Substance Use Topics  . Alcohol use: Yes    Comment: occasionally  . Drug use: No     Allergies   Patient has no known allergies.   Review of Systems Review of Systems  Constitutional: Negative for appetite change.  HENT: Negative for congestion.   Respiratory: Negative for cough and chest tightness.   Cardiovascular: Positive for chest pain.  Gastrointestinal: Negative for abdominal pain.  Genitourinary: Negative for flank pain.  Musculoskeletal: Negative for back pain.       Left shoulder pain.  Left arm pain.  Skin: Negative for rash.  Neurological: Negative for weakness.  Psychiatric/Behavioral: Negative for agitation and confusion.     Physical Exam Updated Vital Signs BP (!) 142/77   Pulse (!) 55   Temp 98.5 F (36.9 C) (Oral)   Resp 14   Ht  (1.702 m)   Wt 69.4 kg (153 lb)   LMP 02/20/2018   SpO2 100%   BMI 23.96 kg/m   Physical Exam  Constitutional: She appears well-developed.  HENT:  Head: Normocephalic.  Neck: Normal range of motion.  Cardiovascular: Regular rhythm and normal pulses.  Pulmonary/Chest: Effort normal and breath sounds normal.  Abdominal: Soft.  Musculoskeletal:  Right lower leg: She exhibits no edema.       Left lower leg: She exhibits no edema.  Some tenderness over left anterior chest and left shoulder.  No deformity.  Good range of motion of the shoulder.  Neurovascularly intact left hand.  Skin: Skin is warm. Capillary refill takes less than 2 seconds.     ED Treatments / Results  Labs (all labs ordered are listed, but only abnormal results are displayed) Labs Reviewed  BASIC METABOLIC PANEL - Abnormal; Notable for the following components:      Result Value   Potassium 3.4 (*)    Glucose, Bld 158 (*)    All other components within normal limits  CBC - Abnormal; Notable for the following components:   Hemoglobin 11.4 (*)     HCT 33.7 (*)    All other components within normal limits  TROPONIN I    EKG EKG Interpretation  Date/Time:  Tuesday Mar 13 2018 17:47:44 EDT Ventricular Rate:  68 PR Interval:  146 QRS Duration: 80 QT Interval:  406 QTC Calculation: 431 R Axis:   73 Text Interpretation:  Normal sinus rhythm Normal ECG Confirmed by Benjiman Core [phone]) on 03/13/2018 7:40:07 PM   Radiology Dg Chest 2 View  Result Date: 03/13/2018 CLINICAL DATA:  42 year old female with left chest pain radiating to the upper extremity. EXAM: CHEST - 2 VIEW COMPARISON:  Chest radiographs 08/24/2015. FINDINGS: Mildly lower lung volumes with crowding of lung markings. Mediastinal contours remain normal. Visualized tracheal air column is within normal limits. No pneumothorax, pulmonary edema, pleural effusion or confluent pulmonary opacity. No acute osseous abnormality identified. Negative visible bowel gas pattern. IMPRESSION: Lower lung volumes, no acute cardiopulmonary abnormality. Electronically Signed   By: Odessa Fleming M.D.   On: 03/13/2018 19:05    Procedures Procedures (including critical care time)  Medications Ordered in ED Medications - No data to display   Initial Impression / Assessment and Plan / ED Course  I have reviewed the triage vital signs and the nursing notes.  Pertinent labs & imaging results that were available during my care of the patient were reviewed by me and considered in my medical decision making (see chart for details).     Patient with left chest and shoulder pain.  EKG and lab work reassuring.  X-ray reassuring.  Likely musculoskeletal cause.  Will have follow-up with sports medicine.  Doubt pulmonary embolism or cardiac cause.  Discharge home.  Final Clinical Impressions(s) / ED Diagnoses   Final diagnoses:  Musculoskeletal pain    ED Discharge Orders        Ordered    methocarbamol (ROBAXIN) 500 MG tablet  Every 8 hours PRN     03/13/18 2056       Benjiman Core, MD 03/13/18 [phone]

## 2018-03-13 NOTE — ED Triage Notes (Signed)
Pt c/o left side chest pain that radiates down arm and causes "numbness" for over a month

## 2019-04-27 ENCOUNTER — Emergency Department (HOSPITAL_BASED_OUTPATIENT_CLINIC_OR_DEPARTMENT_OTHER)
Admission: EM | Admit: 2019-04-27 | Discharge: 2019-04-27 | Disposition: A | Discharge status: Home / Self Care | Payer: Self-pay | Attending: Emergency Medicine | Admitting: Emergency Medicine

## 2019-04-27 ENCOUNTER — Encounter (HOSPITAL_BASED_OUTPATIENT_CLINIC_OR_DEPARTMENT_OTHER): Payer: Self-pay | Admitting: *Deleted

## 2019-04-27 ENCOUNTER — Other Ambulatory Visit: Payer: Self-pay

## 2019-04-27 DIAGNOSIS — Y999 Unspecified external cause status: Secondary | ICD-10-CM | POA: Insufficient documentation

## 2019-04-27 DIAGNOSIS — S161XXA Strain of muscle, fascia and tendon at neck level, initial encounter: Secondary | ICD-10-CM | POA: Insufficient documentation

## 2019-04-27 DIAGNOSIS — Y92410 Unspecified street and highway as the place of occurrence of the external cause: Secondary | ICD-10-CM | POA: Insufficient documentation

## 2019-04-27 DIAGNOSIS — S139XXA Sprain of joints and ligaments of unspecified parts of neck, initial encounter: Secondary | ICD-10-CM

## 2019-04-27 DIAGNOSIS — Z79899 Other long term (current) drug therapy: Secondary | ICD-10-CM | POA: Insufficient documentation

## 2019-04-27 DIAGNOSIS — Y9389 Activity, other specified: Secondary | ICD-10-CM | POA: Insufficient documentation

## 2019-04-27 MED ORDER — TRAMADOL HCL 50 MG PO TABS: 50.0000 mg | ORAL_TABLET | Freq: Four times a day (QID) | ORAL | 0 refills | Status: DC | PRN

## 2019-04-27 MED ORDER — NAPROXEN 500 MG PO TABS: 500.0000 mg | ORAL_TABLET | Freq: Two times a day (BID) | ORAL | 0 refills | Status: DC
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
## 2019-04-27 NOTE — Discharge Instructions (Signed)
Naproxen as prescribed.  Tramadol as prescribed as needed for pain not relieved with naproxen.  Follow-up with your primary doctor if symptoms or not improving in the next week. 
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
## 2020-07-11 ENCOUNTER — Encounter (HOSPITAL_BASED_OUTPATIENT_CLINIC_OR_DEPARTMENT_OTHER): Payer: Self-pay | Admitting: Emergency Medicine

## 2020-07-11 ENCOUNTER — Other Ambulatory Visit: Payer: Self-pay

## 2020-07-11 ENCOUNTER — Emergency Department (HOSPITAL_BASED_OUTPATIENT_CLINIC_OR_DEPARTMENT_OTHER)
Admission: EM | Admit: 2020-07-11 | Discharge: 2020-07-11 | Disposition: A | Discharge status: Home / Self Care | Payer: Self-pay | Attending: Emergency Medicine | Admitting: Emergency Medicine

## 2020-07-11 DIAGNOSIS — J039 Acute tonsillitis, unspecified: Secondary | ICD-10-CM | POA: Insufficient documentation

## 2020-07-11 LAB — GROUP A STREP BY PCR: Group A Strep by PCR: NOT DETECTED

## 2020-07-11 MED ORDER — DEXAMETHASONE SODIUM PHOSPHATE 10 MG/ML IJ SOLN
10.0000 mg | Freq: Once | INTRAMUSCULAR | Status: AC
  Administered 2020-07-11: 10 mg via INTRAMUSCULAR
  Filled 2020-07-11: qty 1

## 2020-07-11 MED ORDER — PREDNISONE 20 MG PO TABS: 20.0000 mg | ORAL_TABLET | Freq: Every day | ORAL | 0 refills | Status: AC

## 2020-07-11 MED ORDER — IBUPROFEN 600 MG PO TABS: 600.0000 mg | ORAL_TABLET | Freq: Four times a day (QID) | ORAL | 0 refills | Status: DC | PRN

## 2020-07-11 MED ORDER — LIDOCAINE VISCOUS HCL 2 % MT SOLN
15.0000 mL | Freq: Once | OROMUCOSAL | Status: AC
Start: 2020-07-11 — End: 2020-07-11
  Administered 2020-07-11: 15 mL via OROMUCOSAL
  Filled 2020-07-11: qty 15

## 2020-07-11 NOTE — ED Triage Notes (Signed)
Pt here with swollen tonsils x 4 days. Long hx of tonsillitis and needs tonsils removed. No other sx.

## 2020-07-11 NOTE — ED Provider Notes (Signed)
[facility] EMERGENCY DEPARTMENT Provider Note   CSN: [id number] Arrival date & time: 07/11/20  1139     History Chief Complaint  Patient presents with   Swollen Tonsils    Stacie Stone is a 44 y.o. female.  Pt presents to the ED today with swollen tonsils.  She said she's had pain and swelling for 4 days.  No sob.  No cough.  No fever.        History reviewed. No pertinent past medical history.  Patient Active Problem List   Diagnosis Date Noted   Tonsillitis 01/15/2013   Hypokalemia 01/15/2013    Past Surgical History:  Procedure Laterality Date   TUBAL LIGATION     TUBAL LIGATION       OB History   No obstetric history on file.     Family History  Problem Relation Age of Onset   Hypertension Mother    Ovarian cancer Sister    Hypertension Sister     Social History   Tobacco Use   Smoking status: Never Smoker   Smokeless tobacco: Never Used  Vaping Use   Vaping Use: Never used  Substance Use Topics   Alcohol use: Not Currently    Comment: occasionally   Drug use: No    Home Medications Prior to Admission medications   Medication Sig Start Date End Date Taking? Authorizing Provider  cyclobenzaprine (FLEXERIL) 10 MG tablet Take 1 tablet (10 mg total) by mouth 2 (two) times daily as needed for muscle spasms. 08/09/17   Tegeler, Canary Brim, MD  ibuprofen (ADVIL) 600 MG tablet Take 1 tablet (600 mg total) by mouth every 6 (six) hours as needed. 07/11/20   Jacalyn Lefevre, MD  methocarbamol (ROBAXIN) 500 MG tablet Take 1 tablet (500 mg total) by mouth every 8 (eight) hours as needed for muscle spasms. 03/13/18   Benjiman Core, MD  naproxen (NAPROSYN) 500 MG tablet Take 1 tablet (500 mg total) by mouth 2 (two) times daily with a meal. 04/27/19   Geoffery Lyons, MD  ondansetron (ZOFRAN) 4 MG tablet Take 1 tablet (4 mg total) by mouth every 8 (eight) hours as needed. 08/09/17   Tegeler, Canary Brim, MD    oxyCODONE-acetaminophen (PERCOCET/ROXICET) 5-325 MG tablet Take 1 tablet by mouth every 4 (four) hours as needed for severe pain. 08/09/17   Tegeler, Canary Brim, MD  predniSONE (DELTASONE) 20 MG tablet Take 1 tablet (20 mg total) by mouth daily for 4 days. 07/11/20 07/15/20  Jacalyn Lefevre, MD  traMADol (ULTRAM) 50 MG tablet Take 1 tablet (50 mg total) by mouth every 6 (six) hours as needed. 04/27/19   Geoffery Lyons, MD    Allergies    Patient has no known allergies.  Review of Systems   Review of Systems  HENT: Positive for sore throat.   All other systems reviewed and are negative.   Physical Exam Updated Vital Signs BP 124/88    Pulse 68    Temp 98.7 F (37.1 C)    Resp 18    SpO2 100%   Physical Exam Vitals and nursing note reviewed.  Constitutional:      Appearance: Normal appearance.  HENT:     Head: Normocephalic and atraumatic.     Right Ear: External ear normal.     Left Ear: External ear normal.     Nose: Nose normal.     Mouth/Throat:     Mouth: Mucous membranes are moist.     Pharynx: Pharyngeal swelling,  oropharyngeal exudate and posterior oropharyngeal erythema present.  Eyes:     Extraocular Movements: Extraocular movements intact.     Conjunctiva/sclera: Conjunctivae normal.     Pupils: Pupils are equal, round, and reactive to light.  Cardiovascular:     Rate and Rhythm: Normal rate and regular rhythm.     Pulses: Normal pulses.     Heart sounds: Normal heart sounds.  Pulmonary:     Effort: Pulmonary effort is normal.     Breath sounds: Normal breath sounds.  Abdominal:     General: Abdomen is flat. Bowel sounds are normal.     Palpations: Abdomen is soft.  Musculoskeletal:        General: Normal range of motion.     Cervical back: Normal range of motion and neck supple.  Skin:    General: Skin is warm.     Capillary Refill: Capillary refill takes less than 2 seconds.  Neurological:     General: No focal deficit present.     Mental Status: She  is alert and oriented to person, place, and time.  Psychiatric:        Mood and Affect: Mood normal.        Behavior: Behavior normal.     ED Results / Procedures / Treatments   Labs (all labs ordered are listed, but only abnormal results are displayed) Labs Reviewed  GROUP A STREP BY PCR    EKG None  Radiology No results found.  Procedures Procedures (including critical care time)  Medications Ordered in ED Medications  dexamethasone (DECADRON) injection 10 mg (10 mg Intramuscular Given 07/11/20 1316)  lidocaine (XYLOCAINE) 2 % viscous mouth solution 15 mL (15 mLs Mouth/Throat Given 07/11/20 1315)    ED Course  I have reviewed the triage vital signs and the nursing notes.  Pertinent labs & imaging results that were available during my care of the patient were reviewed by me and considered in my medical decision making (see chart for details).    MDM Rules/Calculators/A&P                          Strep negative.  Pt is given a rx for ibuprofen and prednisone.  She is to f/u with ENT.  Return if worse.  Final Clinical Impression(s) / ED Diagnoses Final diagnoses:  Tonsillitis    Rx / DC Orders ED Discharge Orders         Ordered    ibuprofen (ADVIL) 600 MG tablet  Every 6 hours PRN        07/11/20 1420    predniSONE (DELTASONE) 20 MG tablet  Daily        07/11/20 1420           Jacalyn Lefevre, MD 07/11/20 1421

## 2021-01-06 ENCOUNTER — Emergency Department (HOSPITAL_BASED_OUTPATIENT_CLINIC_OR_DEPARTMENT_OTHER)
Admission: EM | Admit: 2021-01-06 | Discharge: 2021-01-06 | Disposition: A | Discharge status: Home / Self Care | Payer: Self-pay | Attending: Emergency Medicine | Admitting: Emergency Medicine

## 2021-01-06 ENCOUNTER — Emergency Department (HOSPITAL_BASED_OUTPATIENT_CLINIC_OR_DEPARTMENT_OTHER): Payer: Self-pay

## 2021-01-06 ENCOUNTER — Other Ambulatory Visit: Payer: Self-pay

## 2021-01-06 ENCOUNTER — Encounter (HOSPITAL_BASED_OUTPATIENT_CLINIC_OR_DEPARTMENT_OTHER): Payer: Self-pay

## 2021-01-06 DIAGNOSIS — K5900 Constipation, unspecified: Secondary | ICD-10-CM

## 2021-01-06 DIAGNOSIS — R110 Nausea: Secondary | ICD-10-CM | POA: Insufficient documentation

## 2021-01-06 DIAGNOSIS — Z20822 Contact with and (suspected) exposure to covid-19: Secondary | ICD-10-CM | POA: Insufficient documentation

## 2021-01-06 DIAGNOSIS — K429 Umbilical hernia without obstruction or gangrene: Secondary | ICD-10-CM | POA: Insufficient documentation

## 2021-01-06 HISTORY — DX: Periumbilic swelling, mass or lump: R19.05

## 2021-01-06 HISTORY — DX: Ventral hernia without obstruction or gangrene: K43.9

## 2021-01-06 LAB — HCG, QUANTITATIVE, PREGNANCY: hCG, Beta Chain, Quant, S: <1 m[IU]/mL (ref <5)

## 2021-01-06 LAB — COMPREHENSIVE METABOLIC PANEL
ALT: 35 U/L (ref 0–44)
AST: 43 U/L — ABNORMAL HIGH (ref 15–41)
Albumin: 3.9 g/dL (ref 3.5–5.0)
Alkaline Phosphatase: 49 U/L (ref 38–126)
Anion gap: 9 (ref 5–15)
BUN: 12 mg/dL (ref 6–20)
CO2: 22 mmol/L (ref 22–32)
Calcium: 9.4 mg/dL (ref 8.9–10.3)
Chloride: 107 mmol/L (ref 98–111)
Creatinine, Ser: 0.58 mg/dL (ref 0.44–1.00)
GFR, Estimated: >60 mL/min (ref >60)
Glucose, Bld: 100 mg/dL — ABNORMAL HIGH (ref 70–99)
Potassium: 3.4 mmol/L — ABNORMAL LOW (ref 3.5–5.1)
Sodium: 138 mmol/L (ref 135–145)
Total Bilirubin: 0.4 mg/dL (ref 0.3–1.2)
Total Protein: 7.4 g/dL (ref 6.5–8.1)

## 2021-01-06 LAB — CBC WITH DIFFERENTIAL/PLATELET
Abs Immature Granulocytes: 0 10*3/uL (ref 0.00–0.07)
Basophils Absolute: 0 10*3/uL (ref 0.0–0.1)
Basophils Relative: 0 %
Eosinophils Absolute: 0.4 10*3/uL (ref 0.0–0.5)
Eosinophils Relative: 5 %
HCT: 29 % — ABNORMAL LOW (ref 36.0–46.0)
Hemoglobin: 8.8 g/dL — ABNORMAL LOW (ref 12.0–15.0)
Lymphocytes Relative: 50 %
Lymphs Abs: 4 10*3/uL (ref 0.7–4.0)
MCH: 20.6 pg — ABNORMAL LOW (ref 26.0–34.0)
MCHC: 30.3 g/dL (ref 30.0–36.0)
MCV: 67.8 fL — ABNORMAL LOW (ref 80.0–100.0)
Monocytes Absolute: 0.6 10*3/uL (ref 0.1–1.0)
Monocytes Relative: 7 %
Neutro Abs: 3 10*3/uL (ref 1.7–7.7)
Neutrophils Relative %: 38 %
Platelets: 460 10*3/uL — ABNORMAL HIGH (ref 150–400)
RBC: 4.28 MIL/uL (ref 3.87–5.11)
RDW: 22.2 % — ABNORMAL HIGH (ref 11.5–15.5)
WBC: 7.9 10*3/uL (ref 4.0–10.5)
nRBC: 0 % (ref 0.0–0.2)

## 2021-01-06 LAB — LIPASE, BLOOD: Lipase: 36 U/L (ref 11–51)

## 2021-01-06 LAB — RESP PANEL BY RT-PCR (FLU A&B, COVID) ARPGX2
Influenza A by PCR: NEGATIVE
Influenza B by PCR: NEGATIVE
SARS Coronavirus 2 by RT PCR: NEGATIVE

## 2021-01-06 MED ORDER — HYDROMORPHONE HCL 1 MG/ML IJ SOLN
INTRAMUSCULAR | Status: AC
  Filled 2021-01-06: qty 1

## 2021-01-06 MED ORDER — ONDANSETRON HCL 4 MG/2ML IJ SOLN
4.0000 mg | Freq: Once | INTRAMUSCULAR | Status: AC
  Administered 2021-01-06: 4 mg via INTRAVENOUS
  Filled 2021-01-06: qty 2

## 2021-01-06 MED ORDER — MORPHINE SULFATE (PF) 4 MG/ML IV SOLN
4.0000 mg | Freq: Once | INTRAVENOUS | Status: AC
  Administered 2021-01-06: 4 mg via INTRAVENOUS
  Filled 2021-01-06: qty 1

## 2021-01-06 MED ORDER — HYDROMORPHONE HCL 1 MG/ML IJ SOLN
1.0000 mg | Freq: Once | INTRAMUSCULAR | Status: AC
  Administered 2021-01-06: 1 mg via INTRAVENOUS

## 2021-01-06 MED ORDER — POLYETHYLENE GLYCOL 3350 17 G PO PACK: 17.0000 g | PACK | Freq: Every day | ORAL | 0 refills | Status: DC

## 2021-01-06 MED ORDER — HYDROMORPHONE HCL 1 MG/ML IJ SOLN
1.0000 mg | Freq: Once | INTRAMUSCULAR | Status: AC
Start: 2021-01-06 — End: 2021-01-06
  Administered 2021-01-06: 1 mg via INTRAVENOUS
  Filled 2021-01-06: qty 1

## 2021-01-06 MED ORDER — IOHEXOL 300 MG/ML  SOLN
100.0000 mL | Freq: Once | INTRAMUSCULAR | Status: AC | PRN
  Administered 2021-01-06: 100 mL via INTRAVENOUS

## 2021-01-06 NOTE — ED Notes (Signed)
ED Provider at bedside. 

## 2021-01-06 NOTE — Discharge Instructions (Addendum)
Take 2 packets of MiraLAX daily, may increase 1 packet a day as needed to titrate to desired effect

## 2021-01-06 NOTE — ED Provider Notes (Signed)
Patient signed out to me at 7 AM.  Likely with periumbilical hernia pain.  On my evaluation I was able to reduce this hernia I believe.  Likely fat-containing hernia.  Lab work was overall unremarkable.  Prior provider states that pain was too much to attempt a good reduction.  She felt fairly comfortable on my examination and feel like I was able to reduce part of this hernia.  Will get CT scan to further evaluate.  She had no significant leukocytosis or fever.  Hopefully another round IV pain medication will help.  Will get CT scan to further evaluate for hernia or other intra-abdominal process.  CT scan overall unremarkable.  Positive for constipation.  No residual hernia/incarceration.  Appendix is normal.  Pain is much improved.  Will prescribe MiraLAX.  Discharged in ED in good condition.  Will refer to surgery for possible hernia repair electively.  This chart was dictated using voice recognition software.  Despite best efforts to proofread,  errors can occur which can change the documentation meaning.      Virgina Norfolk, DO 01/06/21 [phone]

## 2021-01-06 NOTE — ED Notes (Signed)
Patient transported to CT 

## 2021-01-06 NOTE — ED Provider Notes (Signed)
[facility] EMERGENCY DEPARTMENT Provider Note   CSN: [id number] Arrival date & time: 01/06/21  0536     History Chief Complaint  Patient presents with  . Abdominal Pain    Stacie Stone is a 45 y.o. female.  HPI     This a 45 year old female with a known umbilical hernia who presents with abdominal pain.  Patient reports that she was at work yesterday when she started having pain at her hernia site.  She also had associated nausea.  She is not had any vomiting or diarrhea.  She states the pain has progressively gotten worse.  It is at her umbilical hernia site.  It is nonradiating.  It is a sharp.  She took some ibuprofen with minimal relief.  She rates her pain at 10 out of 10.  She has not had any recent fevers or illnesses.  No urinary symptoms.  Past Medical History:  Diagnosis Date  . Hernia, ventral   . Periumbilical mass     Patient Active Problem List   Diagnosis Date Noted  . Tonsillitis 01/15/2013  . Hypokalemia 01/15/2013    Past Surgical History:  Procedure Laterality Date  . TUBAL LIGATION    . TUBAL LIGATION       OB History   No obstetric history on file.     Family History  Problem Relation Age of Onset  . Hypertension Mother   . Ovarian cancer Sister   . Hypertension Sister     Social History   Tobacco Use  . Smoking status: Never Smoker  . Smokeless tobacco: Never Used  Vaping Use  . Vaping Use: Never used  Substance Use Topics  . Alcohol use: Not Currently    Comment: occasionally  . Drug use: No    Home Medications Prior to Admission medications   Medication Sig Start Date End Date Taking? Authorizing Provider  cyclobenzaprine (FLEXERIL) 10 MG tablet Take 1 tablet (10 mg total) by mouth 2 (two) times daily as needed for muscle spasms. 08/09/17   Tegeler, Canary Brim, MD  ibuprofen (ADVIL) 600 MG tablet Take 1 tablet (600 mg total) by mouth every 6 (six) hours as needed. 07/11/20   Jacalyn Lefevre, MD  methocarbamol  (ROBAXIN) 500 MG tablet Take 1 tablet (500 mg total) by mouth every 8 (eight) hours as needed for muscle spasms. 03/13/18   Benjiman Core, MD  naproxen (NAPROSYN) 500 MG tablet Take 1 tablet (500 mg total) by mouth 2 (two) times daily with a meal. 04/27/19   Geoffery Lyons, MD  ondansetron (ZOFRAN) 4 MG tablet Take 1 tablet (4 mg total) by mouth every 8 (eight) hours as needed. 08/09/17   Tegeler, Canary Brim, MD  oxyCODONE-acetaminophen (PERCOCET/ROXICET) 5-325 MG tablet Take 1 tablet by mouth every 4 (four) hours as needed for severe pain. 08/09/17   Tegeler, Canary Brim, MD  traMADol (ULTRAM) 50 MG tablet Take 1 tablet (50 mg total) by mouth every 6 (six) hours as needed. 04/27/19   Geoffery Lyons, MD    Allergies    Patient has no known allergies.  Review of Systems   Review of Systems  Constitutional: Negative for fever.  Respiratory: Negative for shortness of breath.   Cardiovascular: Negative for chest pain.  Gastrointestinal: Positive for abdominal pain and nausea. Negative for diarrhea and vomiting.  Genitourinary: Negative for dysuria.  All other systems reviewed and are negative.   Physical Exam Updated Vital Signs BP (!) 145/94 (BP Location: Left Arm)  Pulse 80   Temp 98.9 F (37.2 C) (Oral)   Resp 18   Ht 1.702 m (5\' 7" )   Wt 68.5 kg   LMP 01/04/2021   SpO2 100%   BMI 23.65 kg/m   Physical Exam Vitals and nursing note reviewed.  Constitutional:      Appearance: She is well-developed.     Comments: Uncomfortable appearing but nontoxic  HENT:     Head: Normocephalic and atraumatic.  Eyes:     Pupils: Pupils are equal, round, and reactive to light.  Cardiovascular:     Rate and Rhythm: Normal rate and regular rhythm.     Heart sounds: Normal heart sounds.  Pulmonary:     Effort: Pulmonary effort is normal. No respiratory distress.     Breath sounds: No wheezing.  Abdominal:     General: Bowel sounds are normal.     Palpations: Abdomen is soft.      Tenderness: There is abdominal tenderness in the periumbilical area.     Comments: Palpable umbilical hernia, tender to palpation, no overlying skin changes, patient did not tolerate attempts to reduce  Musculoskeletal:     Cervical back: Neck supple.  Skin:    General: Skin is warm and dry.  Neurological:     Mental Status: She is alert and oriented to person, place, and time.  Psychiatric:        Mood and Affect: Mood normal.     ED Results / Procedures / Treatments   Labs (all labs ordered are listed, but only abnormal results are displayed) Labs Reviewed  CBC WITH DIFFERENTIAL/PLATELET - Abnormal; Notable for the following components:      Result Value   Hemoglobin 8.8 (*)    HCT 29.0 (*)    MCV 67.8 (*)    MCH 20.6 (*)    RDW 22.2 (*)    Platelets 460 (*)    All other components within normal limits  COMPREHENSIVE METABOLIC PANEL - Abnormal; Notable for the following components:   Potassium 3.4 (*)    Glucose, Bld 100 (*)    AST 43 (*)    All other components within normal limits  RESP PANEL BY RT-PCR (FLU A&B, COVID) ARPGX2  LIPASE, BLOOD  HCG, QUANTITATIVE, PREGNANCY    EKG None  Radiology No results found.  Procedures Procedures   Medications Ordered in ED Medications  morphine 4 MG/ML injection 4 mg (4 mg Intravenous Given 01/06/21 0608)  ondansetron (ZOFRAN) injection 4 mg (4 mg Intravenous Given 01/06/21 0608)  HYDROmorphone (DILAUDID) injection 1 mg (1 mg Intravenous Given 01/06/21 01/08/21)    ED Course  I have reviewed the triage vital signs and the nursing notes.  Pertinent labs & imaging results that were available during my care of the patient were reviewed by me and considered in my medical decision making (see chart for details).  Clinical Course as of 01/06/21 0654  Wed Jan 06, 2021  Jan 08, 2021 Attempted reduction at the bedside.  Even with appropriate pain control and coaching, patient did not tolerate procedure well.  Will obtain CT scan for further  evaluation. [CH]    Clinical Course User Index [CH] Horton, 1607, MD   MDM Rules/Calculators/A&P                          Patient presents with abdominal pain and nausea.  She is uncomfortable appearing but otherwise nontoxic.  On exam she clinically has a protuberance at  the umbilical region consistent with umbilical hernia.  Initially did not tolerate attempt at reduction.  She is given pain and nausea medication.  Lab work obtained.  No significant metabolic derangements.  Lipase normal.  Hemoglobin 8.8 which is lower than prior 2 years ago.  See clinical course above.  I did attempt bedside reduction after placing ice and giving pain medication.  Patient continued to be fairly intolerant of the procedure even with coaching.  Will obtain CT scan to rule out incarcerated or strangulated hernia.  She was redosed pain medication.  Patient signed out to oncoming provider. Final Clinical Impression(s) / ED Diagnoses Final diagnoses:  Umbilical hernia without obstruction and without gangrene    Rx / DC Orders ED Discharge Orders    None       Shon Baton, MD 01/06/21 [phone]

## 2021-01-06 NOTE — ED Triage Notes (Signed)
Pt reports known umbilical hernia. Reports pain to umbilical area that started yesterday while at work. Pain has gotten worse throughout the night. Denies N/V/D

## 2021-01-08 LAB — PATHOLOGIST SMEAR REVIEW

## 2021-01-26 ENCOUNTER — Emergency Department (HOSPITAL_BASED_OUTPATIENT_CLINIC_OR_DEPARTMENT_OTHER)
Admission: EM | Admit: 2021-01-26 | Discharge: 2021-01-26 | Disposition: A | Discharge status: Home / Self Care | Payer: 59 | Attending: Emergency Medicine | Admitting: Emergency Medicine

## 2021-01-26 ENCOUNTER — Encounter (HOSPITAL_BASED_OUTPATIENT_CLINIC_OR_DEPARTMENT_OTHER): Payer: Self-pay | Admitting: *Deleted

## 2021-01-26 ENCOUNTER — Other Ambulatory Visit: Payer: Self-pay

## 2021-01-26 DIAGNOSIS — M5441 Lumbago with sciatica, right side: Secondary | ICD-10-CM | POA: Diagnosis not present

## 2021-01-26 DIAGNOSIS — M545 Low back pain, unspecified: Secondary | ICD-10-CM | POA: Diagnosis present

## 2021-01-26 MED ORDER — KETOROLAC TROMETHAMINE 60 MG/2ML IM SOLN
60.0000 mg | Freq: Once | INTRAMUSCULAR | Status: AC
  Administered 2021-01-26: 60 mg via INTRAMUSCULAR
  Filled 2021-01-26: qty 2

## 2021-01-26 MED ORDER — CYCLOBENZAPRINE HCL 10 MG PO TABS: 10.0000 mg | ORAL_TABLET | Freq: Two times a day (BID) | ORAL | 0 refills | Status: DC | PRN

## 2021-01-26 MED ORDER — METHYLPREDNISOLONE 4 MG PO TBPK: ORAL_TABLET | ORAL | 0 refills | Status: DC

## 2021-01-26 MED ORDER — DEXAMETHASONE 6 MG PO TABS
10.0000 mg | ORAL_TABLET | Freq: Once | ORAL | Status: AC
  Administered 2021-01-26: 10 mg via ORAL
  Filled 2021-01-26: qty 1

## 2021-01-26 MED ORDER — IBUPROFEN 600 MG PO TABS: 600.0000 mg | ORAL_TABLET | Freq: Three times a day (TID) | ORAL | 0 refills | Status: DC | PRN

## 2021-01-26 MED ORDER — OXYCODONE HCL 5 MG PO TABS: 5.0000 mg | ORAL_TABLET | Freq: Four times a day (QID) | ORAL | 0 refills | Status: DC | PRN

## 2021-01-26 NOTE — Discharge Instructions (Signed)
Take Motrin as prescribed for the next 5 days and then as needed, take Flexeril as needed, take Medrol Dosepak as prescribed, recommend 1000 mg of Tylenol 4 times a day for the next 5 days and as needed, take Roxicodone for breakthrough pain.  Do not mix with any other alcohol or drugs.

## 2021-01-26 NOTE — ED Triage Notes (Signed)
C/o sudden movt this am with lower back pain which radiates down both legs

## 2021-01-26 NOTE — ED Provider Notes (Signed)
[facility] EMERGENCY DEPARTMENT Provider Note   CSN: [id number] Arrival date & time: 01/26/21  1849     History Chief Complaint  Patient presents with  . Back Pain    Stacie Stone is a 45 y.o. female.  The history is provided by the patient.  Back Pain Location:  Lumbar spine Quality:  Aching Radiates to:  R thigh Pain severity:  Mild Onset quality:  Gradual Timing:  Intermittent Progression:  Waxing and waning Chronicity:  New Context: physical stress   Relieved by:  Nothing Worsened by:  Palpation Ineffective treatments:  None tried Associated symptoms: paresthesias   Associated symptoms: no abdominal pain, no abdominal swelling, no bladder incontinence, no bowel incontinence, no chest pain, no dysuria, no fever, no headaches, no leg pain, no numbness, no pelvic pain, no perianal numbness, no tingling, no weakness and no weight loss        Past Medical History:  Diagnosis Date  . Hernia, ventral   . Periumbilical mass     Patient Active Problem List   Diagnosis Date Noted  . Tonsillitis 01/15/2013  . Hypokalemia 01/15/2013    Past Surgical History:  Procedure Laterality Date  . TUBAL LIGATION    . TUBAL LIGATION       OB History   No obstetric history on file.     Family History  Problem Relation Age of Onset  . Hypertension Mother   . Ovarian cancer Sister   . Hypertension Sister     Social History   Tobacco Use  . Smoking status: Never Smoker  . Smokeless tobacco: Never Used  Vaping Use  . Vaping Use: Never used  Substance Use Topics  . Alcohol use: Not Currently    Comment: occasionally  . Drug use: No    Home Medications Prior to Admission medications   Medication Sig Start Date End Date Taking? Authorizing Provider  methylPREDNISolone (MEDROL DOSEPAK) 4 MG TBPK tablet Follow package insert 01/26/21  Yes Anella Nakata, DO  oxyCODONE (ROXICODONE) 5 MG immediate release tablet Take 1 tablet (5 mg total) by mouth every  6 (six) hours as needed for up to 5 doses for breakthrough pain. 01/26/21  Yes Ari Bernabei, DO  cyclobenzaprine (FLEXERIL) 10 MG tablet Take 1 tablet (10 mg total) by mouth 2 (two) times daily as needed for up to 20 doses for muscle spasms. 01/26/21   Kasir Hallenbeck, DO  ibuprofen (ADVIL) 600 MG tablet Take 1 tablet (600 mg total) by mouth every 8 (eight) hours as needed for up to 30 doses for moderate pain. 01/26/21   Sakai Heinle, DO  methocarbamol (ROBAXIN) 500 MG tablet Take 1 tablet (500 mg total) by mouth every 8 (eight) hours as needed for muscle spasms. 03/13/18   Benjiman Core, MD  naproxen (NAPROSYN) 500 MG tablet Take 1 tablet (500 mg total) by mouth 2 (two) times daily with a meal. 04/27/19   Geoffery Lyons, MD  ondansetron (ZOFRAN) 4 MG tablet Take 1 tablet (4 mg total) by mouth every 8 (eight) hours as needed. 08/09/17   Tegeler, Canary Brim, MD  oxyCODONE-acetaminophen (PERCOCET/ROXICET) 5-325 MG tablet Take 1 tablet by mouth every 4 (four) hours as needed for severe pain. 08/09/17   Tegeler, Canary Brim, MD  polyethylene glycol (MIRALAX / GLYCOLAX) 17 g packet Take 17 g by mouth daily. 01/06/21   Jamier Urbas, DO  traMADol (ULTRAM) 50 MG tablet Take 1 tablet (50 mg total) by mouth every 6 (six) hours as needed.  04/27/19   Geoffery Lyons, MD    Allergies    Patient has no known allergies.  Review of Systems   Review of Systems  Constitutional: Negative for chills, fever and weight loss.  HENT: Negative for ear pain and sore throat.   Eyes: Negative for pain and visual disturbance.  Respiratory: Negative for cough and shortness of breath.   Cardiovascular: Negative for chest pain and palpitations.  Gastrointestinal: Negative for abdominal pain, bowel incontinence and vomiting.  Genitourinary: Negative for bladder incontinence, dysuria, hematuria and pelvic pain.  Musculoskeletal: Positive for back pain and gait problem. Negative for arthralgias.  Skin: Negative for color  change and rash.  Neurological: Positive for paresthesias. Negative for tingling, seizures, syncope, weakness, numbness and headaches.  All other systems reviewed and are negative.   Physical Exam Updated Vital Signs BP (!) 138/101 (BP Location: Right Arm)   Pulse 69   Temp 98.6 F (37 C) (Oral)   Resp 20   Ht 5\' 7"  (1.702 m)   Wt 68.9 kg   LMP 01/04/2021   SpO2 100%   BMI 23.81 kg/m   Physical Exam Vitals and nursing note reviewed.  Constitutional:      General: She is not in acute distress.    Appearance: She is well-developed.  HENT:     Head: Normocephalic and atraumatic.     Nose: Nose normal.     Mouth/Throat:     Mouth: Mucous membranes are moist.  Eyes:     Extraocular Movements: Extraocular movements intact.     Conjunctiva/sclera: Conjunctivae normal.     Pupils: Pupils are equal, round, and reactive to light.  Cardiovascular:     Rate and Rhythm: Normal rate and regular rhythm.     Heart sounds: No murmur heard.   Pulmonary:     Effort: Pulmonary effort is normal. No respiratory distress.     Breath sounds: Normal breath sounds.  Abdominal:     Palpations: Abdomen is soft.     Tenderness: There is no abdominal tenderness.  Musculoskeletal:        General: Tenderness present. Normal range of motion.     Cervical back: Neck supple.     Comments: Tenderness to bilateral paraspinal muscles but worse on the right with increased tone, no midline spinal tenderness, tenderness within the right gluteus area  Skin:    General: Skin is warm and dry.  Neurological:     General: No focal deficit present.     Mental Status: She is alert.     Sensory: No sensory deficit.     Comments: 5+ out of 5 strength throughout, normal sensation     ED Results / Procedures / Treatments   Labs (all labs ordered are listed, but only abnormal results are displayed) Labs Reviewed - No data to display  EKG None  Radiology No results found.  Procedures Procedures    Medications Ordered in ED Medications  ketorolac (TORADOL) injection 60 mg (60 mg Intramuscular Given 01/26/21 1957)  dexamethasone (DECADRON) tablet 10 mg (10 mg Oral Given 01/26/21 1957)    ED Course  I have reviewed the triage vital signs and the nursing notes.  Pertinent labs & imaging results that were available during my care of the patient were reviewed by me and considered in my medical decision making (see chart for details).    MDM Rules/Calculators/A&P  NEVAEH KORTE is here with back pain.  Normal vitals.  No fever.  Manual labor job.  Pain with movement.  Pain in the right gluteus area, right lower back.  Overall consistent with sciatic pain.  History and physical not consistent with cauda equina.  Understands return precautions.  No concern for epidural abscess or other spinal cord compression.  Suspect peripheral nerve irritation.  Will prescribe Medrol Dosepak, Motrin, oxycodone for breakthrough pain.  Understands return precautions.  Neurologically neurovascularly intact on exam.  Discharged in good condition.  This chart was dictated using voice recognition software.  Despite best efforts to proofread,  errors can occur which can change the documentation meaning.    Final Clinical Impression(s) / ED Diagnoses Final diagnoses:  Acute right-sided low back pain with right-sided sciatica    Rx / DC Orders ED Discharge Orders         Ordered    oxyCODONE (ROXICODONE) 5 MG immediate release tablet  Every 6 hours PRN        01/26/21 2023    methylPREDNISolone (MEDROL DOSEPAK) 4 MG TBPK tablet        01/26/21 2023    cyclobenzaprine (FLEXERIL) 10 MG tablet  2 times daily PRN        01/26/21 2023    ibuprofen (ADVIL) 600 MG tablet  Every 8 hours PRN        01/26/21 2023           Virgina Norfolk, DO 01/26/21 2026

## 2021-02-22 ENCOUNTER — Ambulatory Visit (INDEPENDENT_AMBULATORY_CARE_PROVIDER_SITE_OTHER): Payer: 59 | Admitting: Nurse Practitioner

## 2021-02-22 ENCOUNTER — Other Ambulatory Visit (HOSPITAL_COMMUNITY)
Admission: RE | Admit: 2021-02-22 | Discharge: 2021-02-22 | Disposition: A | Discharge status: Home / Self Care | Payer: 59 | Source: Ambulatory Visit | Attending: Nurse Practitioner | Admitting: Nurse Practitioner

## 2021-02-22 ENCOUNTER — Other Ambulatory Visit: Payer: Self-pay

## 2021-02-22 ENCOUNTER — Encounter: Payer: Self-pay | Admitting: Nurse Practitioner

## 2021-02-22 VITALS — BP 140/80 | Ht 67.0 in | Wt 153.0 lb

## 2021-02-22 DIAGNOSIS — Z113 Encounter for screening for infections with a predominantly sexual mode of transmission: Secondary | ICD-10-CM | POA: Insufficient documentation

## 2021-02-22 DIAGNOSIS — N898 Other specified noninflammatory disorders of vagina: Secondary | ICD-10-CM

## 2021-02-22 DIAGNOSIS — Z13228 Encounter for screening for other metabolic disorders: Secondary | ICD-10-CM

## 2021-02-22 DIAGNOSIS — Z130 Encounter for screening for diseases of the blood and blood-forming organs and certain disorders involving the immune mechanism: Secondary | ICD-10-CM

## 2021-02-22 DIAGNOSIS — Z1329 Encounter for screening for other suspected endocrine disorder: Secondary | ICD-10-CM

## 2021-02-22 DIAGNOSIS — Z01419 Encounter for gynecological examination (general) (routine) without abnormal findings: Secondary | ICD-10-CM | POA: Diagnosis not present

## 2021-02-22 DIAGNOSIS — N6011 Diffuse cystic mastopathy of right breast: Secondary | ICD-10-CM

## 2021-02-22 DIAGNOSIS — Z13220 Encounter for screening for lipoid disorders: Secondary | ICD-10-CM

## 2021-02-22 DIAGNOSIS — A5901 Trichomonal vulvovaginitis: Secondary | ICD-10-CM

## 2021-02-22 DIAGNOSIS — N852 Hypertrophy of uterus: Secondary | ICD-10-CM

## 2021-02-22 DIAGNOSIS — N6012 Diffuse cystic mastopathy of left breast: Secondary | ICD-10-CM

## 2021-02-22 DIAGNOSIS — R030 Elevated blood-pressure reading, without diagnosis of hypertension: Secondary | ICD-10-CM

## 2021-02-22 MED ORDER — METRONIDAZOLE 500 MG PO TABS: 500.0000 mg | ORAL_TABLET | Freq: Two times a day (BID) | ORAL | 0 refills | Status: DC

## 2021-02-22 NOTE — Patient Instructions (Addendum)
Health Maintenance, Female Adopting a healthy lifestyle and getting preventive care are important in promoting health and wellness. Ask your health care provider about:  The right schedule for you to have regular tests and exams.  Things you can do on your own to prevent diseases and keep yourself healthy. What should I know about diet, weight, and exercise? Eat a healthy diet  Eat a diet that includes plenty of vegetables, fruits, low-fat dairy products, and lean protein.  Do not eat a lot of foods that are high in solid fats, added sugars, or sodium.   Maintain a healthy weight Body mass index (BMI) is used to identify weight problems. It estimates body fat based on height and weight. Your health care provider can help determine your BMI and help you achieve or maintain a healthy weight. Get regular exercise Get regular exercise. This is one of the most important things you can do for your health. Most adults should:  Exercise for at least 150 minutes each week. The exercise should increase your heart rate and make you sweat (moderate-intensity exercise).  Do strengthening exercises at least twice a week. This is in addition to the moderate-intensity exercise.  Spend less time sitting. Even light physical activity can be beneficial. Watch cholesterol and blood lipids Have your blood tested for lipids and cholesterol at 47 years of age, then have this test every 5 years. Have your cholesterol levels checked more often if:  Your lipid or cholesterol levels are high.  You are older than 47 years of age.  You are at high risk for heart disease. What should I know about cancer screening? Depending on your health history and family history, you may need to have cancer screening at various ages. This may include screening for:  Breast cancer.  Cervical cancer.  Colorectal cancer.  Skin cancer.  Lung cancer. What should I know about heart disease, diabetes, and high blood  pressure? Blood pressure and heart disease  High blood pressure causes heart disease and increases the risk of stroke. This is more likely to develop in people who have high blood pressure readings, are of African descent, or are overweight.  Have your blood pressure checked: ? Every 3-5 years if you are 18-39 years of age. ? Every year if you are 40 years old or older. Diabetes Have regular diabetes screenings. This checks your fasting blood sugar level. Have the screening done:  Once every three years after age 40 if you are at a normal weight and have a low risk for diabetes.  More often and at a younger age if you are overweight or have a high risk for diabetes. What should I know about preventing infection? Hepatitis B If you have a higher risk for hepatitis B, you should be screened for this virus. Talk with your health care provider to find out if you are at risk for hepatitis B infection. Hepatitis C Testing is recommended for:  Everyone born from 1945 through 1965.  Anyone with known risk factors for hepatitis C. Sexually transmitted infections (STIs)  Get screened for STIs, including gonorrhea and chlamydia, if: ? You are sexually active and are younger than 47 years of age. ? You are older than 47 years of age and your health care provider tells you that you are at risk for this type of infection. ? Your sexual activity has changed since you were last screened, and you are at increased risk for chlamydia or gonorrhea. Ask your health care provider   if you are at risk.  Ask your health care provider about whether you are at high risk for HIV. Your health care provider may recommend a prescription medicine to help prevent HIV infection. If you choose to take medicine to prevent HIV, you should first get tested for HIV. You should then be tested every 3 months for as long as you are taking the medicine. Pregnancy  If you are about to stop having your period (premenopausal) and  you may become pregnant, seek counseling before you get pregnant.  Take 400 to 800 micrograms (mcg) of folic acid every day if you become pregnant.  Ask for birth control (contraception) if you want to prevent pregnancy. Osteoporosis and menopause Osteoporosis is a disease in which the bones lose minerals and strength with aging. This can result in bone fractures. If you are 51 years old or older, or if you are at risk for osteoporosis and fractures, ask your health care provider if you should:  Be screened for bone loss.  Take a calcium or vitamin D supplement to lower your risk of fractures.  Be given hormone replacement therapy (HRT) to treat symptoms of menopause. Follow these instructions at home: Lifestyle  Do not use any products that contain nicotine or tobacco, such as cigarettes, e-cigarettes, and chewing tobacco. If you need help quitting, ask your health care provider.  Do not use street drugs.  Do not share needles.  Ask your health care provider for help if you need support or information about quitting drugs. Alcohol use  Do not drink alcohol if: ? Your health care provider tells you not to drink. ? You are pregnant, may be pregnant, or are planning to become pregnant.  If you drink alcohol: ? Limit how much you use to 0-1 drink a day. ? Limit intake if you are breastfeeding.  Be aware of how much alcohol is in your drink. In the [place] one drink equals one 12 oz bottle of beer (355 mL), one 5 oz glass of wine (148 mL), or one 1 oz glass of hard liquor (44 mL). General instructions  Schedule regular health, dental, and eye exams.  Stay current with your vaccines.  Tell your health care provider if: ? You often feel depressed. ? You have ever been abused or do not feel safe at home. Summary  Adopting a healthy lifestyle and getting preventive care are important in promoting health and wellness.  Follow your health care provider's instructions about healthy  diet, exercising, and getting tested or screened for diseases.  Follow your health care provider's instructions on monitoring your cholesterol and blood pressure. This information is not intended to replace advice given to you by your health care provider. Make sure you discuss any questions you have with your health care provider. Document Revised: 09/26/2018 Document Reviewed: 09/26/2018 Elsevier Patient Education  2021 [employer]  Trichomoniasis Trichomoniasis is an STI (sexually transmitted infection) that can affect both women and men. In women, the outer area of the female genitalia (vulva) and the vagina are affected. In men, mainly the penis is affected, but the prostate and other reproductive organs can also be involved.  This condition can be treated with medicine. It often has no symptoms (is asymptomatic), especially in men. If not treated, trichomoniasis can last for months or years. What are the causes? This condition is caused by a parasite called Trichomonas vaginalis. Trichomoniasis most often spreads from person to person (is contagious) through sexual contact. What increases the risk?  The following factors may make you more likely to develop this condition:  Having unprotected sex.  Having sex with a partner who has trichomoniasis.  Having multiple sexual partners.  Having had previous trichomoniasis infections or other STIs. What are the signs or symptoms? In women, symptoms of trichomoniasis include:  Abnormal vaginal discharge that is clear, white, gray, or yellow-green and foamy and has an unusual "fishy" odor.  Itching and irritation of the vagina and vulva.  Burning or pain during urination or sex.  Redness and swelling of the genitals. In men, symptoms of trichomoniasis include:  Penile discharge that may be foamy or contain pus.  Pain in the penis. This may happen only when urinating.  Itching or irritation inside the penis.  Burning after  urination or ejaculation. How is this diagnosed? In women, this condition may be found during a routine Pap test or physical exam. It may be found in men during a routine physical exam. Your health care provider may do tests to help diagnose this infection, such as:  Urine tests (men and women).  The following in women: ? Testing the pH of the vagina. ? A vaginal swab test that checks for the Trichomonas vaginalis parasite. ? Testing vaginal secretions. Your health care provider may test you for other STIs, including HIV (human immunodeficiency virus). How is this treated? This condition is treated with medicine taken by mouth (orally), such as metronidazole or tinidazole, to fight the infection. Your sexual partner(s) also need to be tested and treated.  If you are a woman and you plan to become pregnant or think you may be pregnant, tell your health care provider right away. Some medicines that are used to treat the infection should not be taken during pregnancy. Your health care provider may recommend over-the-counter medicines or creams to help relieve itching or irritation. You may be tested for infection again 3 months after treatment.   Follow these instructions at home:  Take and use over-the-counter and prescription medicines, including creams, only as told by your health care provider.  Take your antibiotic medicine as told by your health care provider. Do not stop taking the antibiotic even if you start to feel better.  Do not have sex until 7-10 days after you finish your medicine, or until your health care provider approves. Ask your health care provider when you may start to have sex again.  (Women) Do not douche or wear tampons while you have the infection.  Discuss your infection with your sexual partner(s). Make sure that your partner gets tested and treated, if necessary.  Keep all follow-up visits as told by your health care provider. This is important. How is this  prevented?  Use condoms every time you have sex. Using condoms correctly and consistently can help protect against STIs.  Avoid having multiple sexual partners.  Talk with your sexual partner about any symptoms that either of you may have, as well as any history of STIs.  Get tested for STIs and STDs (sexually transmitted diseases) before you have sex. Ask your partner to do the same.  Do not have sexual contact if you have symptoms of trichomoniasis or another STI.   Contact a health care provider if:  You still have symptoms after you finish your medicine.  You develop pain in your abdomen.  You have pain when you urinate.  You have bleeding after sex.  You develop a rash.  You feel nauseous or you vomit.  You plan to become  pregnant or think you may be pregnant. Summary  Trichomoniasis is an STI (sexually transmitted infection) that can affect both women and men.  This condition often has no symptoms (is asymptomatic), especially in men.  Without treatment, this condition can last for months or years.  You should not have sex until 7-10 days after you finish your medicine, or until your health care provider approves. Ask your health care provider when you may start to have sex again.  Discuss your infection with your sexual partner(s). Make sure that your partner gets tested and treated, if necessary. This information is not intended to replace advice given to you by your health care provider. Make sure you discuss any questions you have with your health care provider. Document Revised: 07/17/2018 Document Reviewed: 07/17/2018 Elsevier Patient Education  2021 [employer] Inc[employer]  Uterine Fibroids (suspect, will confirm with [place])  Uterine fibroids, also called leiomyomas, are noncancerous (benign) tumors that can grow in the uterus. They can cause heavy menstrual bleeding and pain. Fibroids may also grow in the fallopian tubes, cervix, or tissues (ligaments) near the  uterus. You may have one or many fibroids. Fibroids vary in size, weight, and where they grow in the uterus. Some can become quite large. Most fibroids do not require medical treatment. What are the causes? The cause of this condition is not known. What increases the risk? You are more likely to develop this condition if you:  Are in your 30s or 40s and have not gone through menopause.  Have a family history of this condition.  Are of African American descent.  Started your menstrual period at age 90 or younger.  Have never given birth.  Are overweight or obese. What are the signs or symptoms? Many women do not have any symptoms. Symptoms of this condition may include:  Heavy menstrual bleeding.  Bleeding between menstrual periods.  Pain and pressure in the pelvic area, between your hip bones.  Pain during sex.  Bladder problems, such as needing to urinate right away or more often than usual.  Inability to have children (infertility).  Failure to carry pregnancy to term (miscarriage). How is this diagnosed? This condition may be diagnosed based on:  Your symptoms and medical history.  A physical exam.  A pelvic exam that includes feeling for any tumors.  Imaging tests, such as ultrasound or MRI. How is this treated? Treatment for this condition may include follow-up visits with your health care provider to monitor your fibroids for any changes. Other treatment may include:  Medicines, such as: ? Medicines to relieve pain, including aspirin and NSAIDs, such as ibuprofen or naproxen. ? Hormone therapy. Treatment may be given as a pill or an injection, or it may be inserted into the uterus using an intrauterine device (IUD).  Surgery that would do one of the following: ? Remove the fibroids (myomectomy). This may be recommended if fibroids affect your fertility and you want to become pregnant. ? Remove the uterus (hysterectomy). ? Block the blood supply to the  fibroids (uterine artery embolization). This can cause them to shrink and die. Follow these instructions at home: Medicines  Take over-the-counter and prescription medicines only as told by your health care provider.  Ask your health care provider if you should take iron pills or eat more iron-rich foods, such as dark green, leafy vegetables. Heavy menstrual bleeding can cause low iron levels. Managing pain If directed, apply heat to your back or abdomen to reduce pain. Use the heat source  that your health care provider recommends, such as a moist heat pack or a heating pad. To apply heat:  Place a towel between your skin and the heat source.  Leave the heat on for 20-30 minutes.  Remove the heat if your skin turns bright red. This is especially important if you are unable to feel pain, heat, or cold. You may have a greater risk of getting burned.   General instructions  Pay close attention to your menstrual cycle. Tell your health care provider about any changes, such as: ? Heavier bleeding that requires you to change your pads or tampons more than usual. ? A change in the number of days that your menstrual period lasts. ? A change in symptoms that come with your menstrual period, such as back pain or cramps in your abdomen.  Keep all follow-up visits. This is important, especially if your fibroids need to be monitored for any changes. Contact a health care provider if you:  Have pelvic pain, back pain, or cramps in your abdomen that do not get better with medicine or heat.  Develop new bleeding between menstrual periods.  Have increased bleeding during or between menstrual periods.  Feel more tired or weak than usual.  Feel light-headed. Get help right away if you:  Faint.  Have pelvic pain that suddenly gets worse.  Have severe vaginal bleeding that soaks a tampon or pad in 30 minutes or less. Summary  Uterine fibroids are noncancerous (benign) tumors that can develop in  the uterus.  The exact cause of this condition is not known.  Most fibroids do not require medical treatment unless they affect your ability to have children (fertility).  Contact a health care provider if you have pelvic pain, back pain, or cramps in your abdomen that do not get better with medicines.  Get help right away if you faint, have pelvic pain that suddenly gets worse, or have severe vaginal bleeding. This information is not intended to replace advice given to you by your health care provider. Make sure you discuss any questions you have with your health care provider. Document Revised: 05/05/2020 Document Reviewed: 05/05/2020 Elsevier Patient Education  2021 [employer] Inc.   Hypertension, Adult Hypertension is another name for high blood pressure. High blood pressure forces your heart to work harder to pump blood. This can cause problems over time. There are two numbers in a blood pressure reading. There is a top number (systolic) over a bottom number (diastolic). It is best to have a blood pressure that is below 120/80. Healthy choices can help lower your blood pressure, or you may need medicine to help lower it. What are the causes? The cause of this condition is not known. Some conditions may be related to high blood pressure. What increases the risk?  Smoking.  Having type 2 diabetes mellitus, high cholesterol, or both.  Not getting enough exercise or physical activity.  Being overweight.  Having too much fat, sugar, calories, or salt (sodium) in your diet.  Drinking too much alcohol.  Having long-term (chronic) kidney disease.  Having a family history of high blood pressure.  Age. Risk increases with age.  Race. You may be at higher risk if you are African American.  Gender. Men are at higher risk than women before age 3. After age 60, women are at higher risk than men.  Having obstructive sleep apnea.  Stress. What are the signs or symptoms?  High  blood pressure may not cause symptoms. Very high  blood pressure (hypertensive crisis) may cause: ? Headache. ? Feelings of worry or nervousness (anxiety). ? Shortness of breath. ? Nosebleed. ? A feeling of being sick to your stomach (nausea). ? Throwing up (vomiting). ? Changes in how you see. ? Very bad chest pain. ? Seizures. How is this treated?  This condition is treated by making healthy lifestyle changes, such as: ? Eating healthy foods. ? Exercising more. ? Drinking less alcohol.  Your health care provider may prescribe medicine if lifestyle changes are not enough to get your blood pressure under control, and if: ? Your top number is above 130. ? Your bottom number is above 80.  Your personal target blood pressure may vary. Follow these instructions at home: Eating and drinking  If told, follow the DASH eating plan. To follow this plan: ? Fill one half of your plate at each meal with fruits and vegetables. ? Fill one fourth of your plate at each meal with whole grains. Whole grains include whole-wheat pasta, brown rice, and whole-grain bread. ? Eat or drink low-fat dairy products, such as skim milk or low-fat yogurt. ? Fill one fourth of your plate at each meal with low-fat (lean) proteins. Low-fat proteins include fish, chicken without skin, eggs, beans, and tofu. ? Avoid fatty meat, cured and processed meat, or chicken with skin. ? Avoid pre-made or processed food.  Eat less than 1,500 mg of salt each day.  Do not drink alcohol if: ? Your doctor tells you not to drink. ? You are pregnant, may be pregnant, or are planning to become pregnant.  If you drink alcohol: ? Limit how much you use to:  0-1 drink a day for women.  0-2 drinks a day for men. ? Be aware of how much alcohol is in your drink. In the [place] one drink equals one 12 oz bottle of beer (355 mL), one 5 oz glass of wine (148 mL), or one 1 oz glass of hard liquor (44 mL).   Lifestyle  Work with your  doctor to stay at a healthy weight or to lose weight. Ask your doctor what the best weight is for you.  Get at least 30 minutes of exercise most days of the week. This may include walking, swimming, or biking.  Get at least 30 minutes of exercise that strengthens your muscles (resistance exercise) at least 3 days a week. This may include lifting weights or doing Pilates.  Do not use any products that contain nicotine or tobacco, such as cigarettes, e-cigarettes, and chewing tobacco. If you need help quitting, ask your doctor.  Check your blood pressure at home as told by your doctor.  Keep all follow-up visits as told by your doctor. This is important.   Medicines  Take over-the-counter and prescription medicines only as told by your doctor. Follow directions carefully.  Do not skip doses of blood pressure medicine. The medicine does not work as well if you skip doses. Skipping doses also puts you at risk for problems.  Ask your doctor about side effects or reactions to medicines that you should watch for. Contact a doctor if you:  Think you are having a reaction to the medicine you are taking.  Have headaches that keep coming back (recurring).  Feel dizzy.  Have swelling in your ankles.  Have trouble with your vision. Get help right away if you:  Get a very bad headache.  Start to feel mixed up (confused).  Feel weak or numb.  Feel faint.  Have very bad pain in your: ? Chest. ? Belly (abdomen).  Throw up more than once.  Have trouble breathing. Summary  Hypertension is another name for high blood pressure.  High blood pressure forces your heart to work harder to pump blood.  For most people, a normal blood pressure is less than 120/80.  Making healthy choices can help lower blood pressure. If your blood pressure does not get lower with healthy choices, you may need to take medicine. This information is not intended to replace advice given to you by your health  care provider. Make sure you discuss any questions you have with your health care provider. Document Revised: 06/13/2018 Document Reviewed: 06/13/2018 Elsevier Patient Education  2021 [employer].

## 2021-02-22 NOTE — Progress Notes (Signed)
45 y.o. Stacie Stone Single Black or [place] American female here for annual exam.   Last GYN appointment Unknown Reports hemorrhoids, sometimes painful. Denies constipation.  Periods periods usually regular once per month but last month had 2 periods the second week in April and again the last week in April. That is the first time that ever happen. The last period lasted 7 days and usually stops at 3.  Went to health department 1 month ago and had STD testing, all normal except BV. Treated with a pill. Noticing a discharge starting again today. No irritation.   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 Enjoys work. Has 2 children (25, 27).   Period Cycle (Days): 28 Period Duration (Days): 3 Period Pattern: Regular Menstrual Flow: Heavy Dysmenorrhea: (!) Mild Dysmenorrhea Symptoms: Cramping Patient's last menstrual period was 02/09/2021.            Sexually active: Yes.   long term partner x 6 years The current method of family planning is BTL.    Exercising: Yes.    Home exercise routine includes walking 5 hrs per days. Smoker:  yes  Health Maintenance: Pap:  ? yrs History of abnormal Pap:  no MMG:  Never Colonoscopy: Never BMD:  None Gardasil: No  Covid-19: No Hep C testing:no Screening Labs:no   reports that she has been smoking. She has never used smokeless tobacco. She reports current alcohol use. She reports that she does not use drugs.  Past Medical History:  Diagnosis Date  . Hernia, ventral   . Periumbilical mass     Past Surgical History:  Procedure Laterality Date  . TUBAL LIGATION    . TUBAL LIGATION      Current Outpatient Medications  Medication Sig Dispense Refill  . ibuprofen (ADVIL) 600 MG tablet Take 1 tablet (600 mg total) by mouth every 8 (eight) hours as needed for up to 30 doses for moderate pain. 30 tablet 0  . methocarbamol (ROBAXIN) 500 MG tablet Take 1 tablet (500 mg total) by mouth every 8 (eight) hours as needed for muscle spasms. 8 tablet 0   . methylPREDNISolone (MEDROL DOSEPAK) 4 MG TBPK tablet Follow package insert 21 each 0  . metroNIDAZOLE (FLAGYL) 500 MG tablet Take 1 tablet (500 mg total) by mouth 2 (two) times daily. 14 tablet 0  . naproxen (NAPROSYN) 500 MG tablet Take 1 tablet (500 mg total) by mouth 2 (two) times daily with a meal. 20 tablet 0  . polyethylene glycol (MIRALAX / GLYCOLAX) 17 g packet Take 17 g by mouth daily. 14 each 0  . cyclobenzaprine (FLEXERIL) 10 MG tablet Take 1 tablet (10 mg total) by mouth 2 (two) times daily as needed for up to 20 doses for muscle spasms. (Patient not taking: Reported on 02/22/2021) 20 tablet 0   No current facility-administered medications for this visit.    Family History  Problem Relation Age of Onset  . Hypertension Mother   . Hypertension Sister     Review of Systems  Constitutional: Negative.   HENT: Negative.   Eyes: Negative.   Respiratory: Negative.   Cardiovascular: Negative.   Gastrointestinal: Positive for rectal pain.       Hemorrhoid, some times painful  Endocrine: Negative.   Genitourinary: Positive for vaginal bleeding and vaginal discharge.  Musculoskeletal: Negative.   Skin: Negative.   Allergic/Immunologic: Negative.   Neurological: Negative.   Hematological: Negative.   Psychiatric/Behavioral: Negative.   All other systems reviewed and are negative.   Exam:  BP 140/80   Ht _0  (1.702 m)   Wt 153 lb (69.4 kg)   LMP 02/09/2021   BMI 23.96 kg/m   Height: _1  (170.2 cm) Repeat B/P 160/90  General appearance: alert, cooperative and appears stated age, no acute distress Head: Normocephalic, without obvious abnormality Neck: no adenopathy, thyroid normal to inspection and palpation Lungs: clear to auscultation bilaterally Breasts: normal appearance, dense and fibrocystic bilaterally, difficult exam r/t multiple densities Heart: regular rate and rhythm Abdomen: soft, non-tender; umbilical hernia noted,  no organomegaly Extremities:  extremities normal, no edema Skin: No rashes or lesions Lymph nodes: Cervical, supraclavicular, and axillary nodes normal. + inguinal nodes palpated, 1cm right groin Neurologic: Grossly normal   Pelvic: External genitalia:  no lesions              Urethra:  normal appearing urethra with no masses, tenderness or lesions              Bartholins and Skenes: normal                 Vagina: large amount yellow discharge, ph >5, malodorus              Cervix: neg cervical motion tenderness, structure noted on cervix (polyp vs fibroid) see diagram, friable             Bimanual Exam:   Uterus:  enlarged, 12 week size weeks size and irregular              Adnexa: no mass, fullness, tenderness               Rectal, hemorrhoid, firm uterus noted posteriorly  Physical Exam Genitourinary:       + trichomonas, neg yeast , + clue  Kim, CMA Chaperone was present for exam.  A/P:  1. Well woman exam  2. Vaginal discharge - Plan: [place], CLUE.  Trichomonas vaginalis (TV) infection /BV - Plan: metroNIDAZOLE (FLAGYL) 500 MG tablet. Discussed partner treatment at Health Department or at PCP. Will need to wait 7 days after both treated to resume sexual activity. F/U 3 months to rescreen.  3.Screen for sexually transmitted diseases - Plan:collected with Cytology - PAP( [place])  4. Fibrocystic breast changes of both breasts- reiterated importance of scheduling mammogram. Information provided about the [facility]. Difficult exam r/t multiple densities, no one dominant mass  5. Screening for lipid disorders - Plan: Lipid Profile  6. Screening for endocrine, metabolic, and immunity disorder - Plan: Comp Met (CMET), Hemoglobin A1c  7. Screening for deficiency anemia - Plan: CBC  8. Enlarged uterus/Cervical abnormality (polyp vs fibroid) - Plan: US PELVIS TRANSVAGINAL NON-OB (TV ONLY), follow up with MD  9. Elevated blood pressure reading, Discussed importance of  finding PCP. List of primary care Providers given to call.

## 2021-02-23 ENCOUNTER — Other Ambulatory Visit: Payer: Self-pay | Admitting: Nurse Practitioner

## 2021-02-23 DIAGNOSIS — N921 Excessive and frequent menstruation with irregular cycle: Secondary | ICD-10-CM

## 2021-02-23 MED ORDER — NORETHINDRONE 0.35 MG PO TABS: 1.0000 | ORAL_TABLET | Freq: Every day | ORAL | 1 refills | Status: AC

## 2021-02-24 ENCOUNTER — Telehealth: Payer: Self-pay | Admitting: *Deleted

## 2021-02-24 ENCOUNTER — Telehealth: Payer: Self-pay | Admitting: Hematology and Oncology

## 2021-02-24 DIAGNOSIS — D649 Anemia, unspecified: Secondary | ICD-10-CM

## 2021-02-24 LAB — CYTOLOGY - PAP
Chlamydia: NEGATIVE
Comment: NEGATIVE
Comment: NEGATIVE
Comment: NORMAL
High risk HPV: NEGATIVE
Neisseria Gonorrhea: NEGATIVE

## 2021-02-24 NOTE — Telephone Encounter (Signed)
Received a new hem referral from Clarita Crane, NP for anemia. Ms. Mantei has been cld and scheduled to see Dr. Pamelia Hoit on 5/16 at 4pm. Pt aware to arrive 20 minutes early.

## 2021-02-24 NOTE — Telephone Encounter (Signed)
-----   Message from Keenan Bachelor, [place] sent at 02/24/2021  8:58 AM EDT ----- Regarding: referral to Hematology Per Tresa Endo Dixon's result note "I would also like to refer her to hematology."  Her hemoglobin is 8.5. Very anemic.  Tresa Endo told patient. Thanks!

## 2021-02-24 NOTE — Telephone Encounter (Signed)
Referral placed at [facility], they will call patient to schedule.

## 2021-02-25 LAB — WET PREP FOR TRICH, YEAST, CLUE

## 2021-02-26 NOTE — Progress Notes (Signed)
Please call and verify she understand results/answer any questions. She has her appointment already scheduled for 1 year.  Using "Rarely Screened ASCCP algorithm: LGSIL with Negative HR HPV". (2% 5 -year risk of CIN 3+) Recommend F/U 1 year (HPV and Pap co-testing)

## 2021-02-27 LAB — COMPREHENSIVE METABOLIC PANEL
AG Ratio: 1.4 (calc) (ref 1.0–2.5)
ALT: 7 U/L (ref 6–29)
AST: 11 U/L (ref 10–35)
Albumin: 4.3 g/dL (ref 3.6–5.1)
Alkaline phosphatase (APISO): 56 U/L (ref 31–125)
BUN: 11 mg/dL (ref 7–25)
CO2: 23 mmol/L (ref 20–32)
Calcium: 9.8 mg/dL (ref 8.6–10.2)
Chloride: 106 mmol/L (ref 98–110)
Creat: 0.64 mg/dL (ref 0.50–1.10)
Globulin: 3 g/dL (calc) (ref 1.9–3.7)
Glucose, Bld: 92 mg/dL (ref 65–99)
Potassium: 3.7 mmol/L (ref 3.5–5.3)
Sodium: 137 mmol/L (ref 135–146)
Total Bilirubin: 0.3 mg/dL (ref 0.2–1.2)
Total Protein: 7.3 g/dL (ref 6.1–8.1)

## 2021-02-27 LAB — CBC
HCT: 29.9 % — ABNORMAL LOW (ref 35.0–45.0)
Hemoglobin: 8.5 g/dL — ABNORMAL LOW (ref 11.7–15.5)
MCH: 19.7 pg — ABNORMAL LOW (ref 27.0–33.0)
MCHC: 28.4 g/dL — ABNORMAL LOW (ref 32.0–36.0)
MCV: 69.4 fL — ABNORMAL LOW (ref 80.0–100.0)
MPV: 9.7 fL (ref 7.5–12.5)
Platelets: 408 10*3/uL — ABNORMAL HIGH (ref 140–400)
RBC: 4.31 10*6/uL (ref 3.80–5.10)
RDW: 18.7 % — ABNORMAL HIGH (ref 11.0–15.0)
WBC: 6.7 10*3/uL (ref 3.8–10.8)

## 2021-02-27 LAB — IRON,TIBC AND FERRITIN PANEL
%SAT: 4 % (calc) — ABNORMAL LOW (ref 16–45)
Ferritin: 4 ng/mL — ABNORMAL LOW (ref 16–232)
Iron: 18 ug/dL — ABNORMAL LOW (ref 40–190)
TIBC: 450 mcg/dL (calc) (ref 250–450)

## 2021-02-27 LAB — HEMOGLOBIN A1C
Hgb A1c MFr Bld: 5.3 % of total Hgb (ref <5.7)
Mean Plasma Glucose: 105 mg/dL
eAG (mmol/L): 5.8 mmol/L

## 2021-02-27 LAB — LIPID PANEL
Cholesterol: 185 mg/dL (ref <200)
HDL: 55 mg/dL (ref >=50)
LDL Cholesterol (Calc): 104 mg/dL (calc) — ABNORMAL HIGH
Non-HDL Cholesterol (Calc): 130 mg/dL (calc) — ABNORMAL HIGH (ref <130)
Total CHOL/HDL Ratio: 3.4 (calc) (ref <5.0)
Triglycerides: 148 mg/dL (ref <150)

## 2021-02-28 NOTE — Progress Notes (Signed)
[facility] CONSULT NOTE  Patient Care Team: Patient, No Pcp Per (Inactive) as PCP - General (General Practice)  CHIEF COMPLAINTS/PURPOSE OF CONSULTATION:  Newly diagnosed anemia  HISTORY OF PRESENTING ILLNESS:  Stacie Stone 45 y.o. female is here because of recent diagnosis of anemia. She is referred by Clarita Crane, NP. Labs on 02/22/21 showed Hg 8.5, HCT 29.9, platelets 408, iron saturation 4%, ferritin 4. She presents to the clinic today for initial evaluation and discussion of treatment options.  Over the past several months she has been experiencing pica for ice chips, also severe fatigue and generalized aches and pains.  She tried over-the-counter iron supplements but they have not helped her significantly.  She denies any heavy menstrual cycles.  She has not noticed any blood in the stools.  She tells me that she eats a normal diet.  I reviewed her records extensively and collaborated the history with the patient.  MEDICAL HISTORY:  Past Medical History:  Diagnosis Date  . Hernia, ventral   . Periumbilical mass     SURGICAL HISTORY: Past Surgical History:  Procedure Laterality Date  . TUBAL LIGATION    . TUBAL LIGATION      SOCIAL HISTORY: Social History   Socioeconomic History  . Marital status: Single    Spouse name: Not on file  . Number of children: Not on file  . Years of education: Not on file  . Highest education level: Not on file  Occupational History  . Not on file  Tobacco Use  . Smoking status: Current Some Day Smoker  . Smokeless tobacco: Never Used  Vaping Use  . Vaping Use: Never used  Substance and Sexual Activity  . Alcohol use: Yes    Comment: occasionally  . Drug use: No  . Sexual activity: Yes    Birth control/protection: Surgical    Comment: BTL  Other Topics Concern  . Not on file  Social History Narrative  . Not on file   Social Determinants of Health   Financial Resource Strain: Not on file  Food Insecurity: Not  on file  Transportation Needs: Not on file  Physical Activity: Not on file  Stress: Not on file  Social Connections: Not on file  Intimate Partner Violence: Not on file    FAMILY HISTORY: Family History  Problem Relation Age of Onset  . Hypertension Mother   . Hypertension Sister     ALLERGIES:  has No Known Allergies.  MEDICATIONS:  Current Outpatient Medications  Medication Sig Dispense Refill  . cyclobenzaprine (FLEXERIL) 10 MG tablet Take 1 tablet (10 mg total) by mouth 2 (two) times daily as needed for up to 20 doses for muscle spasms. (Patient not taking: Reported on 02/22/2021) 20 tablet 0  . ibuprofen (ADVIL) 600 MG tablet Take 1 tablet (600 mg total) by mouth every 8 (eight) hours as needed for up to 30 doses for moderate pain. 30 tablet 0  . methocarbamol (ROBAXIN) 500 MG tablet Take 1 tablet (500 mg total) by mouth every 8 (eight) hours as needed for muscle spasms. 8 tablet 0  . methylPREDNISolone (MEDROL DOSEPAK) 4 MG TBPK tablet Follow package insert 21 each 0  . metroNIDAZOLE (FLAGYL) 500 MG tablet Take 1 tablet (500 mg total) by mouth 2 (two) times daily. 14 tablet 0  . naproxen (NAPROSYN) 500 MG tablet Take 1 tablet (500 mg total) by mouth 2 (two) times daily with a meal. 20 tablet 0  . norethindrone (MICRONOR) 0.35 MG  tablet Take 1 tablet (0.35 mg total) by mouth daily. 28 tablet 1  . polyethylene glycol (MIRALAX / GLYCOLAX) 17 g packet Take 17 g by mouth daily. 14 each 0   No current facility-administered medications for this visit.    REVIEW OF SYSTEMS:   Constitutional: Denies fevers, chills or abnormal night sweats   All other systems were reviewed with the patient and are negative.  PHYSICAL EXAMINATION: ECOG PERFORMANCE STATUS: 1 - Symptomatic but completely ambulatory  Vitals:   03/01/21 1547  BP: (!) 145/77  Pulse: 83  Resp: 18  Temp: 97.9 F (36.6 C)  SpO2: 100%   Filed Weights   03/01/21 1547  Weight: 151 lb (68.5 kg)    LABORATORY DATA:   I have reviewed the data as listed Lab Results  Component Value Date   WBC 6.7 02/22/2021   HGB 8.5 (L) 02/22/2021   HCT 29.9 (L) 02/22/2021   MCV 69.4 (L) 02/22/2021   PLT 408 (H) 02/22/2021   Lab Results  Component Value Date   NA 137 02/22/2021   K 3.7 02/22/2021   CL 106 02/22/2021   CO2 23 02/22/2021    RADIOGRAPHIC STUDIES: I have personally reviewed the radiological reports and agreed with the findings in the report.  ASSESSMENT AND PLAN:  Iron deficiency anemia 02/22/21 showed Hg 8.5, HCT 29.9, platelets 408, iron saturation 4%, ferritin 4  Iron deficiency anemia: I discussed with the patient the process of iron absorption. I counseled extensively regarding the different causes of iron deficiency including blood loss and malabsorption. Patient will need upper endoscopies and colonoscopies and did not have any clear identified source of blood loss. It is possible that the patient may still have an occult source of bleeding. However malabsorption is also a possibility.   Recommendation: proceed with IV iron infusions Gastroenterology referral to make sure there is no GI source of blood loss.  I discussed with the patient that potentially there may be a need for additional IV iron infusions if the iron levels were to remain low in the future. The frequency of need of IV iron would depend on many other factors including the rate of loss and by the degree of absorption.  Return to clinic in 3 months with recheck on iron studies and hemoglobin.     All questions were answered. The patient knows to call the clinic with any problems, questions or concerns.   Sabas Sous, MD, MPH 03/01/2021    I, Molly Dorshimer, am acting as scribe for Serena Croissant, MD.  I have reviewed the above documentation for accuracy and completeness, and I agree with the above.

## 2021-03-01 ENCOUNTER — Inpatient Hospital Stay: Payer: 59 | Attending: Hematology and Oncology | Admitting: Hematology and Oncology

## 2021-03-01 ENCOUNTER — Other Ambulatory Visit: Payer: Self-pay

## 2021-03-01 DIAGNOSIS — D509 Iron deficiency anemia, unspecified: Secondary | ICD-10-CM | POA: Diagnosis present

## 2021-03-01 DIAGNOSIS — F17200 Nicotine dependence, unspecified, uncomplicated: Secondary | ICD-10-CM | POA: Diagnosis not present

## 2021-03-01 NOTE — Telephone Encounter (Signed)
Patient scheduled on 03/01/21 with Dr.Gudena

## 2021-03-01 NOTE — Assessment & Plan Note (Signed)
02/22/21 showed Hg 8.5, HCT 29.9, platelets 408, iron saturation 4%, ferritin 4  Iron deficiency anemia: I discussed with the patient the process of iron absorption. I counseled extensively regarding the different causes of iron deficiency including blood loss and malabsorption. Patient will need upper endoscopies and colonoscopies and did not have any clear identified source of blood loss. It is possible that the patient may still have an occult source of bleeding. However malabsorption is also a possibility.   Recommendation: 1. Stop oral iron 2. proceed with IV iron infusions  I discussed with the patient that potentially there may be a need for additional IV iron infusions if the iron levels were to remain low in the future. The frequency of need of IV iron would depend on many other factors including the rate of loss and by the degree of absorption.  Return to clinic in 3 months with recheck on iron studies and hemoglobin.

## 2021-03-01 NOTE — Progress Notes (Signed)
Iron is very low. Please notify patient and verify that she is able to tolerate and is taking oral iron supplementation. Reiterate importance to keep hematology appointment.

## 2021-03-11 ENCOUNTER — Ambulatory Visit: Payer: Self-pay | Admitting: General Surgery

## 2021-03-12 ENCOUNTER — Ambulatory Visit (HOSPITAL_BASED_OUTPATIENT_CLINIC_OR_DEPARTMENT_OTHER): Admission: RE | Admit: 2021-03-12 | Payer: 59 | Source: Home / Self Care | Admitting: Otolaryngology

## 2021-03-12 ENCOUNTER — Encounter (HOSPITAL_BASED_OUTPATIENT_CLINIC_OR_DEPARTMENT_OTHER): Admission: RE | Payer: Self-pay | Source: Home / Self Care

## 2021-03-12 SURGERY — TONSILLECTOMY AND ADENOIDECTOMY
Anesthesia: General | Laterality: Bilateral

## 2021-03-16 ENCOUNTER — Other Ambulatory Visit: Payer: 59 | Admitting: Obstetrics & Gynecology

## 2021-03-16 ENCOUNTER — Other Ambulatory Visit: Payer: 59

## 2021-03-16 DIAGNOSIS — Z0289 Encounter for other administrative examinations: Secondary | ICD-10-CM

## 2021-03-17 NOTE — Patient Instructions (Addendum)
DUE TO COVID-19 ONLY ONE VISITOR IS ALLOWED TO COME WITH YOU AND STAY IN THE WAITING ROOM ONLY DURING PRE OP AND PROCEDURE DAY OF SURGERY. THE 1 VISITOR  MAY VISIT WITH YOU AFTER SURGERY IN YOUR PRIVATE ROOM DURING VISITING HOURS ONLY!               Stacie Stone   Your procedure is scheduled on: 03/30/21   Report to [facility] Main  Entrance   Report to admitting at: 9:45 AM     Call this number if you have problems the morning of surgery [phone]    Remember:  NO SOLID FOOD AFTER MIDNIGHT THE NIGHT PRIOR TO SURGERY. NOTHING BY MOUTH EXCEPT CLEAR LIQUIDS UNTIL: 8:45 AM . PLEASE FINISH ENSURE DRINK PER SURGEON ORDER  WHICH NEEDS TO BE COMPLETED AT: 8:45 AM .  CLEAR LIQUID DIET  Foods Allowed                                                                     Foods Excluded  Coffee and tea, regular and decaf                             liquids that you cannot  Plain Jell-O any favor except red or purple                                           see through such as: Fruit ices (not with fruit pulp)                                     milk, soups, orange juice  Iced Popsicles                                    All solid food Carbonated beverages, regular and diet                                    Cranberry, grape and apple juices Sports drinks like Gatorade Lightly seasoned clear broth or consume(fat free) Sugar, honey syrup  Sample Menu Breakfast                                Lunch                                     Supper Cranberry juice                    Beef broth                            Chicken broth Jell-O  Grape juice                           Apple juice Coffee or tea                        Jell-O                                      Popsicle                                                Coffee or tea                        Coffee or tea  _____________________________________________________________________   BRUSH  YOUR TEETH MORNING OF SURGERY AND RINSE YOUR MOUTH OUT, NO CHEWING GUM CANDY OR MINTS.    Take these medicines the morning of surgery with A SIP OF WATER: Norethindrone.                               You may not have any metal on your body including hair pins and              piercings  Do not wear jewelry, make-up, lotions, powders or perfumes, deodorant             Do not wear nail polish on your fingernails.  Do not shave  48 hours prior to surgery.    Do not bring valuables to the hospital. [facility] IS NOT             RESPONSIBLE   FOR VALUABLES.  Contacts, dentures or bridgework may not be worn into surgery.  Leave suitcase in the car. After surgery it may be brought to your room.     Patients discharged the day of surgery will not be allowed to drive home. IF YOU ARE HAVING SURGERY AND GOING HOME THE SAME DAY, YOU MUST HAVE AN ADULT TO DRIVE YOU HOME AND BE WITH YOU FOR 24 HOURS. YOU MAY GO HOME BY TAXI OR UBER OR ORTHERWISE, BUT AN ADULT MUST ACCOMPANY YOU HOME AND STAY WITH YOU FOR 24 HOURS.  Name and phone number of your driver:  Special Instructions: N/A              Please read over the following fact sheets you were given: _____________________________________________________________________         [facility] - Preparing for Surgery Before surgery, you can play an important role.  Because skin is not sterile, your skin needs to be as free of germs as possible.  You can reduce the number of germs on your skin by washing with CHG (chlorahexidine gluconate) soap before surgery.  CHG is an antiseptic cleaner which kills germs and bonds with the skin to continue killing germs even after washing. Please DO NOT use if you have an allergy to CHG or antibacterial soaps.  If your skin becomes reddened/irritated stop using the CHG and inform your nurse when you arrive at Short Stay. Do not shave (including legs and underarms) for at least 48 hours prior to the first CHG shower.  You may  shave your face/neck. Please follow these instructions carefully:  1.  Shower with CHG Soap the night before surgery and the  morning of Surgery.  2.  If you choose to wash your hair, wash your hair first as usual with your  normal  shampoo.  3.  After you shampoo, rinse your hair and body thoroughly to remove the  shampoo.                           4.  Use CHG as you would any other liquid soap.  You can apply chg directly  to the skin and wash                       Gently with a scrungie or clean washcloth.  5.  Apply the CHG Soap to your body ONLY FROM THE NECK DOWN.   Do not use on face/ open                           Wound or open sores. Avoid contact with eyes, ears mouth and genitals (private parts).                       Wash face,  Genitals (private parts) with your normal soap.             6.  Wash thoroughly, paying special attention to the area where your surgery  will be performed.  7.  Thoroughly rinse your body with warm water from the neck down.  8.  DO NOT shower/wash with your normal soap after using and rinsing off  the CHG Soap.                9.  Pat yourself dry with a clean towel.            10.  Wear clean pajamas.            11.  Place clean sheets on your bed the night of your first shower and do not  sleep with pets. Day of Surgery : Do not apply any lotions/deodorants the morning of surgery.  Please wear clean clothes to the hospital/surgery center.  FAILURE TO FOLLOW THESE INSTRUCTIONS MAY RESULT IN THE CANCELLATION OF YOUR SURGERY PATIENT SIGNATURE_________________________________  NURSE SIGNATURE__________________________________  ________________________________________________________________________

## 2021-03-18 ENCOUNTER — Other Ambulatory Visit: Payer: Self-pay

## 2021-03-18 ENCOUNTER — Encounter (HOSPITAL_COMMUNITY): Payer: Self-pay

## 2021-03-18 ENCOUNTER — Other Ambulatory Visit: Payer: Self-pay | Admitting: Hematology and Oncology

## 2021-03-18 ENCOUNTER — Encounter (HOSPITAL_COMMUNITY)
Admission: RE | Admit: 2021-03-18 | Discharge: 2021-03-18 | Disposition: A | Discharge status: Home / Self Care | Payer: 59 | Source: Ambulatory Visit | Attending: General Surgery | Admitting: General Surgery

## 2021-03-18 DIAGNOSIS — Z01812 Encounter for preprocedural laboratory examination: Secondary | ICD-10-CM | POA: Diagnosis present

## 2021-03-18 LAB — CBC
HCT: 28.9 % — ABNORMAL LOW (ref 36.0–46.0)
Hemoglobin: 8.6 g/dL — ABNORMAL LOW (ref 12.0–15.0)
MCH: 20.5 pg — ABNORMAL LOW (ref 26.0–34.0)
MCHC: 29.8 g/dL — ABNORMAL LOW (ref 30.0–36.0)
MCV: 69 fL — ABNORMAL LOW (ref 80.0–100.0)
Platelets: 428 10*3/uL — ABNORMAL HIGH (ref 150–400)
RBC: 4.19 MIL/uL (ref 3.87–5.11)
RDW: 21.2 % — ABNORMAL HIGH (ref 11.5–15.5)
WBC: 5.8 10*3/uL (ref 4.0–10.5)
nRBC: 0 % (ref 0.0–0.2)

## 2021-03-18 LAB — BASIC METABOLIC PANEL
Anion gap: 6 (ref 5–15)
BUN: 15 mg/dL (ref 6–20)
CO2: 26 mmol/L (ref 22–32)
Calcium: 9.8 mg/dL (ref 8.9–10.3)
Chloride: 106 mmol/L (ref 98–111)
Creatinine, Ser: 0.63 mg/dL (ref 0.44–1.00)
GFR, Estimated: >60 mL/min (ref >60)
Glucose, Bld: 85 mg/dL (ref 70–99)
Potassium: 3.8 mmol/L (ref 3.5–5.1)
Sodium: 138 mmol/L (ref 135–145)

## 2021-03-18 NOTE — Progress Notes (Signed)
COVID Vaccine Completed: NO Date COVID Vaccine completed: COVID vaccine manufacturer: Pfizer    Moderna   [employer]   PCP - NO PCP Cardiologist -   Chest x-ray -  EKG -  Stress Test -  ECHO -  Cardiac Cath -  Pacemaker/ICD device last checked:  Sleep Study -  CPAP -   Fasting Blood Sugar -  Checks Blood Sugar _____ times a day  Blood Thinner Instructions: Aspirin Instructions: Last Dose:  Anesthesia review:   Patient denies shortness of breath, fever, cough and chest pain at PAT appointment   Patient verbalized understanding of instructions that were given to them at the PAT appointment. Patient was also instructed that they will need to review over the PAT instructions again at home before surgery. 

## 2021-03-19 ENCOUNTER — Inpatient Hospital Stay: Payer: 59 | Attending: Hematology and Oncology

## 2021-03-19 DIAGNOSIS — D509 Iron deficiency anemia, unspecified: Secondary | ICD-10-CM | POA: Insufficient documentation

## 2021-03-19 NOTE — Progress Notes (Signed)
Labs results: Hemoglobin: 8.6 HCT: 28.9

## 2021-03-24 ENCOUNTER — Encounter: Payer: Self-pay | Admitting: *Deleted

## 2021-03-24 ENCOUNTER — Telehealth: Payer: Self-pay | Admitting: *Deleted

## 2021-03-24 NOTE — Progress Notes (Signed)
Received call from [facility] stating pt is scheduled to undergo open ventral hernia repair with mesh on 03/30/21.  [facility] is requesting advice from MD if okay to proceed with surgery and recent Hgb 8.6.  RN will review with MD for further recommendations.

## 2021-03-24 NOTE — Telephone Encounter (Signed)
RN attempt x1 to contact pt regarding IV iron schedule.  No answer, LVM to return call to the office.

## 2021-03-25 ENCOUNTER — Encounter (HOSPITAL_BASED_OUTPATIENT_CLINIC_OR_DEPARTMENT_OTHER): Payer: Self-pay

## 2021-03-25 ENCOUNTER — Other Ambulatory Visit: Payer: Self-pay

## 2021-03-25 ENCOUNTER — Inpatient Hospital Stay: Payer: 59

## 2021-03-25 VITALS — BP 137/71 | HR 63 | Temp 98.7°F | Resp 16

## 2021-03-25 DIAGNOSIS — D509 Iron deficiency anemia, unspecified: Secondary | ICD-10-CM

## 2021-03-25 DIAGNOSIS — R0683 Snoring: Secondary | ICD-10-CM

## 2021-03-25 MED ORDER — SODIUM CHLORIDE 0.9 % IV SOLN
300.0000 mg | Freq: Once | INTRAVENOUS | Status: AC
  Administered 2021-03-25: 300 mg via INTRAVENOUS
  Filled 2021-03-25: qty 300

## 2021-03-25 MED ORDER — SODIUM CHLORIDE 0.9 % IV SOLN
Freq: Once | INTRAVENOUS | Status: AC
  Filled 2021-03-25: qty 250

## 2021-03-25 NOTE — Patient Instructions (Signed)

## 2021-03-25 NOTE — Progress Notes (Signed)
Patient was observed for 30 minutes post iron infusion with no complaints. Vitals stable upon discharge and patient in no distress.  

## 2021-03-26 ENCOUNTER — Other Ambulatory Visit: Payer: Self-pay | Admitting: *Deleted

## 2021-03-26 ENCOUNTER — Telehealth: Payer: Self-pay | Admitting: *Deleted

## 2021-03-26 DIAGNOSIS — D509 Iron deficiency anemia, unspecified: Secondary | ICD-10-CM

## 2021-03-26 NOTE — Telephone Encounter (Signed)
Per MD pt needing Hgb checked prior to surgery.  RN attempt x1 to contact pt.  No answer, LVM to return call to the office.

## 2021-03-29 ENCOUNTER — Other Ambulatory Visit: Payer: Self-pay

## 2021-03-29 ENCOUNTER — Inpatient Hospital Stay: Payer: 59

## 2021-03-29 DIAGNOSIS — D509 Iron deficiency anemia, unspecified: Secondary | ICD-10-CM | POA: Diagnosis not present

## 2021-03-29 LAB — CBC WITH DIFFERENTIAL (CANCER CENTER ONLY)
Abs Immature Granulocytes: 0.03 10*3/uL (ref 0.00–0.07)
Basophils Absolute: 0 10*3/uL (ref 0.0–0.1)
Basophils Relative: 1 %
Eosinophils Absolute: 0.1 10*3/uL (ref 0.0–0.5)
Eosinophils Relative: 1 %
HCT: 29.9 % — ABNORMAL LOW (ref 36.0–46.0)
Hemoglobin: 9.2 g/dL — ABNORMAL LOW (ref 12.0–15.0)
Immature Granulocytes: 0 %
Lymphocytes Relative: 37 %
Lymphs Abs: 3.2 10*3/uL (ref 0.7–4.0)
MCH: 21.1 pg — ABNORMAL LOW (ref 26.0–34.0)
MCHC: 30.8 g/dL (ref 30.0–36.0)
MCV: 68.7 fL — ABNORMAL LOW (ref 80.0–100.0)
Monocytes Absolute: 0.3 10*3/uL (ref 0.1–1.0)
Monocytes Relative: 4 %
Neutro Abs: 4.9 10*3/uL (ref 1.7–7.7)
Neutrophils Relative %: 57 %
Platelet Count: 337 10*3/uL (ref 150–400)
RBC: 4.35 MIL/uL (ref 3.87–5.11)
RDW: 22.3 % — ABNORMAL HIGH (ref 11.5–15.5)
WBC Count: 8.5 10*3/uL (ref 4.0–10.5)
nRBC: 0 % (ref 0.0–0.2)

## 2021-03-29 MED ORDER — BUPIVACAINE LIPOSOME 1.3 % IJ SUSP
20.0000 mL | Freq: Once | INTRAMUSCULAR | Status: DC
  Filled 2021-03-29: qty 20

## 2021-03-30 ENCOUNTER — Ambulatory Visit (HOSPITAL_COMMUNITY): Payer: 59 | Admitting: Certified Registered Nurse Anesthetist

## 2021-03-30 ENCOUNTER — Encounter (HOSPITAL_COMMUNITY): Payer: Self-pay | Admitting: General Surgery

## 2021-03-30 ENCOUNTER — Ambulatory Visit (HOSPITAL_COMMUNITY): Payer: 59 | Admitting: Physician Assistant

## 2021-03-30 ENCOUNTER — Encounter (HOSPITAL_COMMUNITY)
Admission: RE | Disposition: A | Discharge status: Home / Self Care | Payer: Self-pay | Source: Home / Self Care | Attending: General Surgery

## 2021-03-30 ENCOUNTER — Ambulatory Visit (HOSPITAL_COMMUNITY)
Admission: RE | Admit: 2021-03-30 | Discharge: 2021-03-30 | Disposition: A | Discharge status: Home / Self Care | Payer: 59 | Attending: General Surgery | Admitting: General Surgery

## 2021-03-30 ENCOUNTER — Other Ambulatory Visit: Payer: Self-pay

## 2021-03-30 DIAGNOSIS — F17200 Nicotine dependence, unspecified, uncomplicated: Secondary | ICD-10-CM | POA: Diagnosis not present

## 2021-03-30 DIAGNOSIS — K439 Ventral hernia without obstruction or gangrene: Secondary | ICD-10-CM | POA: Diagnosis present

## 2021-03-30 DIAGNOSIS — Z79899 Other long term (current) drug therapy: Secondary | ICD-10-CM | POA: Diagnosis not present

## 2021-03-30 HISTORY — PX: VENTRAL HERNIA REPAIR: SHX424

## 2021-03-30 LAB — PREGNANCY, URINE: Preg Test, Ur: NEGATIVE

## 2021-03-30 SURGERY — REPAIR, HERNIA, VENTRAL
Anesthesia: General | Site: Abdomen

## 2021-03-30 MED ORDER — OXYCODONE HCL 5 MG PO TABS
5.0000 mg | ORAL_TABLET | Freq: Once | ORAL | Status: AC | PRN
Start: 2021-03-30 — End: 2021-03-30
  Administered 2021-03-30: 5 mg via ORAL

## 2021-03-30 MED ORDER — CELECOXIB 200 MG PO CAPS
400.0000 mg | ORAL_CAPSULE | ORAL | Status: AC
  Administered 2021-03-30: 400 mg via ORAL
  Filled 2021-03-30: qty 2

## 2021-03-30 MED ORDER — IBUPROFEN 800 MG PO TABS: 800.0000 mg | ORAL_TABLET | Freq: Three times a day (TID) | ORAL | 0 refills | Status: DC | PRN

## 2021-03-30 MED ORDER — FENTANYL CITRATE (PF) 100 MCG/2ML IJ SOLN
INTRAMUSCULAR | Status: AC
  Filled 2021-03-30: qty 2

## 2021-03-30 MED ORDER — OXYCODONE HCL 5 MG/5ML PO SOLN: 5.0000 mg | Freq: Once | ORAL | Status: AC | PRN

## 2021-03-30 MED ORDER — CHLORHEXIDINE GLUCONATE CLOTH 2 % EX PADS: 6.0000 | MEDICATED_PAD | Freq: Once | CUTANEOUS | Status: DC

## 2021-03-30 MED ORDER — SUGAMMADEX SODIUM 200 MG/2ML IV SOLN
INTRAVENOUS | Status: DC | PRN
  Administered 2021-03-30: 160 mg via INTRAVENOUS

## 2021-03-30 MED ORDER — 0.9 % SODIUM CHLORIDE (POUR BTL) OPTIME
TOPICAL | Status: DC | PRN
  Administered 2021-03-30: 1000 mL

## 2021-03-30 MED ORDER — FENTANYL CITRATE (PF) 100 MCG/2ML IJ SOLN
INTRAMUSCULAR | Status: DC | PRN
  Administered 2021-03-30 (×3): 50 ug via INTRAVENOUS
  Administered 2021-03-30: 25 ug via INTRAVENOUS

## 2021-03-30 MED ORDER — ROCURONIUM BROMIDE 10 MG/ML (PF) SYRINGE
PREFILLED_SYRINGE | INTRAVENOUS | Status: AC
  Filled 2021-03-30: qty 10

## 2021-03-30 MED ORDER — DEXAMETHASONE SODIUM PHOSPHATE 10 MG/ML IJ SOLN
INTRAMUSCULAR | Status: DC | PRN
  Administered 2021-03-30: 10 mg via INTRAVENOUS

## 2021-03-30 MED ORDER — ONDANSETRON HCL 4 MG/2ML IJ SOLN: 4.0000 mg | Freq: Once | INTRAMUSCULAR | Status: DC | PRN

## 2021-03-30 MED ORDER — BUPIVACAINE HCL (PF) 0.25 % IJ SOLN
INTRAMUSCULAR | Status: DC | PRN
  Administered 2021-03-30: 30 mL

## 2021-03-30 MED ORDER — ACETAMINOPHEN 500 MG PO TABS
1000.0000 mg | ORAL_TABLET | ORAL | Status: AC
  Administered 2021-03-30: 1000 mg via ORAL
  Filled 2021-03-30: qty 2

## 2021-03-30 MED ORDER — LACTATED RINGERS IV SOLN: INTRAVENOUS | Status: DC

## 2021-03-30 MED ORDER — ONDANSETRON HCL 4 MG/2ML IJ SOLN
INTRAMUSCULAR | Status: AC
  Filled 2021-03-30: qty 2

## 2021-03-30 MED ORDER — OXYCODONE HCL 5 MG PO TABS: 5.0000 mg | ORAL_TABLET | Freq: Four times a day (QID) | ORAL | 0 refills | Status: DC | PRN

## 2021-03-30 MED ORDER — ORAL CARE MOUTH RINSE: 15.0000 mL | Freq: Once | OROMUCOSAL | Status: AC

## 2021-03-30 MED ORDER — ROCURONIUM BROMIDE 100 MG/10ML IV SOLN
INTRAVENOUS | Status: DC | PRN
  Administered 2021-03-30: 30 mg via INTRAVENOUS
  Administered 2021-03-30: 20 mg via INTRAVENOUS

## 2021-03-30 MED ORDER — SUCCINYLCHOLINE CHLORIDE 20 MG/ML IJ SOLN
INTRAMUSCULAR | Status: DC | PRN
  Administered 2021-03-30: 120 mg via INTRAVENOUS

## 2021-03-30 MED ORDER — MIDAZOLAM HCL 5 MG/5ML IJ SOLN
INTRAMUSCULAR | Status: DC | PRN
  Administered 2021-03-30: 2 mg via INTRAVENOUS

## 2021-03-30 MED ORDER — BUPIVACAINE HCL 0.25 % IJ SOLN
INTRAMUSCULAR | Status: AC
  Filled 2021-03-30: qty 1

## 2021-03-30 MED ORDER — FENTANYL CITRATE (PF) 100 MCG/2ML IJ SOLN
25.0000 ug | INTRAMUSCULAR | Status: DC | PRN
  Administered 2021-03-30: 50 ug via INTRAVENOUS

## 2021-03-30 MED ORDER — SCOPOLAMINE 1 MG/3DAYS TD PT72
1.0000 | MEDICATED_PATCH | TRANSDERMAL | Status: DC
  Administered 2021-03-30: 1.5 mg via TRANSDERMAL
  Filled 2021-03-30: qty 1

## 2021-03-30 MED ORDER — OXYCODONE HCL 5 MG PO TABS
ORAL_TABLET | ORAL | Status: AC
  Filled 2021-03-30: qty 1

## 2021-03-30 MED ORDER — ONDANSETRON HCL 4 MG/2ML IJ SOLN
INTRAMUSCULAR | Status: DC | PRN
  Administered 2021-03-30: 4 mg via INTRAVENOUS

## 2021-03-30 MED ORDER — LIDOCAINE HCL (CARDIAC) PF 100 MG/5ML IV SOSY
PREFILLED_SYRINGE | INTRAVENOUS | Status: DC | PRN
  Administered 2021-03-30: 100 mg via INTRAVENOUS

## 2021-03-30 MED ORDER — PROPOFOL 10 MG/ML IV BOLUS
INTRAVENOUS | Status: AC
  Filled 2021-03-30: qty 20

## 2021-03-30 MED ORDER — CHLORHEXIDINE GLUCONATE 0.12 % MT SOLN
15.0000 mL | Freq: Once | OROMUCOSAL | Status: AC
  Administered 2021-03-30: 15 mL via OROMUCOSAL

## 2021-03-30 MED ORDER — PROPOFOL 10 MG/ML IV BOLUS
INTRAVENOUS | Status: DC | PRN
  Administered 2021-03-30: 200 mg via INTRAVENOUS

## 2021-03-30 MED ORDER — BUPIVACAINE LIPOSOME 1.3 % IJ SUSP
INTRAMUSCULAR | Status: DC | PRN
  Administered 2021-03-30: 20 mL

## 2021-03-30 MED ORDER — ENSURE PRE-SURGERY PO LIQD
296.0000 mL | Freq: Once | ORAL | Status: DC
  Filled 2021-03-30: qty 296

## 2021-03-30 MED ORDER — SUCCINYLCHOLINE CHLORIDE 200 MG/10ML IV SOSY
PREFILLED_SYRINGE | INTRAVENOUS | Status: AC
  Filled 2021-03-30: qty 10

## 2021-03-30 MED ORDER — MIDAZOLAM HCL 2 MG/2ML IJ SOLN
INTRAMUSCULAR | Status: AC
  Filled 2021-03-30: qty 2

## 2021-03-30 MED ORDER — LIDOCAINE 2% (20 MG/ML) 5 ML SYRINGE
INTRAMUSCULAR | Status: AC
  Filled 2021-03-30: qty 5

## 2021-03-30 MED ORDER — CEFAZOLIN SODIUM-DEXTROSE 2-4 GM/100ML-% IV SOLN
2.0000 g | INTRAVENOUS | Status: AC
  Administered 2021-03-30: 2 g via INTRAVENOUS
  Filled 2021-03-30: qty 100

## 2021-03-30 MED ORDER — DEXAMETHASONE SODIUM PHOSPHATE 10 MG/ML IJ SOLN
INTRAMUSCULAR | Status: AC
  Filled 2021-03-30: qty 1

## 2021-03-30 SURGICAL SUPPLY — 43 items
ADH SKN CLS APL DERMABOND .7 (GAUZE/BANDAGES/DRESSINGS) ×1
APL PRP STRL LF DISP 70% ISPRP (MISCELLANEOUS) ×1
APL SKNCLS STERI-STRIP NONHPOA (GAUZE/BANDAGES/DRESSINGS)
BENZOIN TINCTURE PRP APPL 2/3 (GAUZE/BANDAGES/DRESSINGS) ×1 IMPLANT
BLADE SURG 15 STRL LF DISP TIS (BLADE) ×1 IMPLANT
BLADE SURG 15 STRL SS (BLADE) ×3
CHLORAPREP W/TINT 26 (MISCELLANEOUS) ×3 IMPLANT
CLOSURE WOUND 1/2 X4 (GAUZE/BANDAGES/DRESSINGS)
COVER SURGICAL LIGHT HANDLE (MISCELLANEOUS) ×3 IMPLANT
COVER WAND RF STERILE (DRAPES) IMPLANT
DECANTER SPIKE VIAL GLASS SM (MISCELLANEOUS) ×3 IMPLANT
DERMABOND ADVANCED (GAUZE/BANDAGES/DRESSINGS) ×2
DERMABOND ADVANCED .7 DNX12 (GAUZE/BANDAGES/DRESSINGS) IMPLANT
DRAIN CHANNEL 19F RND (DRAIN) IMPLANT
DRAPE LAPAROSCOPIC ABDOMINAL (DRAPES) ×3 IMPLANT
DRSG TELFA 4X10 ISLAND STR (GAUZE/BANDAGES/DRESSINGS) IMPLANT
DRSG TELFA 4X8 ISLAND (GAUZE/BANDAGES/DRESSINGS) IMPLANT
ELECT REM PT RETURN 15FT ADLT (MISCELLANEOUS) ×3 IMPLANT
EVACUATOR SILICONE 100CC (DRAIN) IMPLANT
GLOVE SURG POLYISO LF SZ7 (GLOVE) ×3 IMPLANT
GLOVE SURG UNDER POLY LF SZ7 (GLOVE) ×3 IMPLANT
GOWN STRL REUS W/TWL LRG LVL3 (GOWN DISPOSABLE) ×3 IMPLANT
GOWN STRL REUS W/TWL XL LVL3 (GOWN DISPOSABLE) ×3 IMPLANT
KIT BASIN OR (CUSTOM PROCEDURE TRAY) ×3 IMPLANT
KIT TURNOVER KIT A (KITS) ×3 IMPLANT
NEEDLE HYPO 22GX1.5 SAFETY (NEEDLE) ×3 IMPLANT
PACK GENERAL/GYN (CUSTOM PROCEDURE TRAY) ×3 IMPLANT
PENCIL SMOKE EVACUATOR (MISCELLANEOUS) IMPLANT
SPONGE DRAIN TRACH 4X4 STRL 2S (GAUZE/BANDAGES/DRESSINGS) IMPLANT
SPONGE LAP 18X18 RF (DISPOSABLE) ×2 IMPLANT
STRIP CLOSURE SKIN 1/2X4 (GAUZE/BANDAGES/DRESSINGS) IMPLANT
SUT ETHILON 2 0 PS N (SUTURE) IMPLANT
SUT MNCRL AB 4-0 PS2 18 (SUTURE) ×3 IMPLANT
SUT NOVA 0 T19/GS 22DT (SUTURE) IMPLANT
SUT NOVA NAB GS-21 0 18 T12 DT (SUTURE) IMPLANT
SUT PDS AB 0 CT1 36 (SUTURE) ×6 IMPLANT
SUT VIC AB 2-0 CT1 27 (SUTURE)
SUT VIC AB 2-0 CT1 TAPERPNT 27 (SUTURE) IMPLANT
SUT VIC AB 3-0 SH 27 (SUTURE) ×3
SUT VIC AB 3-0 SH 27XBRD (SUTURE) ×1 IMPLANT
SYR 20ML LL LF (SYRINGE) ×3 IMPLANT
TOWEL OR 17X26 10 PK STRL BLUE (TOWEL DISPOSABLE) ×3 IMPLANT
TOWEL OR NON WOVEN STRL DISP B (DISPOSABLE) ×3 IMPLANT

## 2021-03-30 NOTE — Anesthesia Preprocedure Evaluation (Addendum)
Anesthesia Evaluation  Patient identified by MRN, date of birth, ID band Patient awake    Reviewed: Allergy & Precautions, NPO status , Patient's Chart, lab work & pertinent test results  Airway Mallampati: II  TM Distance: >3 FB Neck ROM: Full    Dental  (+) Teeth Intact, Dental Advisory Given, Chipped,    Pulmonary Current Smoker and Patient abstained from smoking.,    Pulmonary exam normal breath sounds clear to auscultation       Cardiovascular negative cardio ROS Normal cardiovascular exam Rhythm:Regular Rate:Normal     Neuro/Psych negative neurological ROS  negative psych ROS   GI/Hepatic negative GI ROS, Neg liver ROS,   Endo/Other  negative endocrine ROS  Renal/GU negative Renal ROS  negative genitourinary   Musculoskeletal Ventral hernia Periumbilical mass    Abdominal   Peds  Hematology  (+) anemia , REFUSES BLOOD PRODUCTS,   Anesthesia Other Findings   Reproductive/Obstetrics                           Anesthesia Physical Anesthesia Plan  ASA: 2  Anesthesia Plan: General   Post-op Pain Management:    Induction: Intravenous  PONV Risk Score and Plan: 4 or greater and Treatment may vary due to age or medical condition, Midazolam, Ondansetron and Dexamethasone  Airway Management Planned: LMA and Oral ETT  Additional Equipment:   Intra-op Plan:   Post-operative Plan: Extubation in OR  Informed Consent: I have reviewed the patients History and Physical, chart, labs and discussed the procedure including the risks, benefits and alternatives for the proposed anesthesia with the patient or authorized representative who has indicated his/her understanding and acceptance.     Dental advisory given and History available from chart only  Plan Discussed with: Anesthesiologist and CRNA  Anesthesia Plan Comments:         Anesthesia Quick Evaluation

## 2021-03-30 NOTE — Discharge Instructions (Signed)
[facility]  UMBILICAL OR INGUINAL HERNIA REPAIR: POST OP INSTRUCTIONS  Always review your discharge instruction sheet given to you by the facility where your surgery was performed. IF YOU HAVE DISABILITY OR FAMILY LEAVE FORMS, YOU MUST BRING THEM TO THE OFFICE FOR PROCESSING.   DO NOT GIVE THEM TO YOUR DOCTOR.  1. A  prescription for pain medication may be given to you upon discharge.  Take your pain medication as prescribed, if needed.  If narcotic pain medicine is not needed, then you may take acetaminophen (Tylenol) or ibuprofen (Advil) as needed. 2. Take your usually prescribed medications unless otherwise directed. If you need a refill on your pain medication, please contact your pharmacy.  They will contact our office to request authorization. Prescriptions will not be filled after 5 pm or on week-ends. 3. You should follow a light diet the first 24 hours after arrival home, such as soup and crackers, etc.  Be sure to include lots of fluids daily.  Resume your normal diet the day after surgery. 4.Most patients will experience some swelling and bruising around the umbilicus or in the groin and scrotum.  Ice packs and reclining will help.  Swelling and bruising can take several days to resolve.  6. It is common to experience some constipation if taking pain medication after surgery.  Increasing fluid intake and taking a stool softener (such as Colace) will usually help or prevent this problem from occurring.  A mild laxative (Milk of Magnesia or Miralax) should be taken according to package directions if there are no bowel movements after 48 hours. 7. Unless discharge instructions indicate otherwise, you may remove your bandages 24-48 hours after surgery, and you may shower at that time.  You may have steri-strips (small skin tapes) in place directly over the incision.  These strips should be left on the skin for 7-10 days.  If your surgeon used skin glue on the  incision, you may shower in 24 hours.  The glue will flake off over the next 2-3 weeks.  Any sutures or staples will be removed at the office during your follow-up visit. 8. ACTIVITIES:  You may resume regular (light) daily activities beginning the next day--such as daily self-care, walking, climbing stairs--gradually increasing activities as tolerated.  You may have sexual intercourse when it is comfortable.  Refrain from any heavy lifting or straining until approved by your doctor.  a.You may drive when you are no longer taking prescription pain medication, you can comfortably wear a seatbelt, and you can safely maneuver your car and apply brakes. b.RETURN TO WORK:   _____________________________________________  9.You should see your doctor in the office for a follow-up appointment approximately 2-3 weeks after your surgery.  Make sure that you call for this appointment within a day or two after you arrive home to insure a convenient appointment time. 10.OTHER INSTRUCTIONS: _________________________    _____________________________________  WHEN TO CALL YOUR DOCTOR: Fever over 101.0 Inability to urinate Nausea and/or vomiting Extreme swelling or bruising Continued bleeding from incision. Increased pain, redness, or drainage from the incision  The clinic staff is available to answer your questions during regular business hours.  Please don't hesitate to call and ask to speak to one of the nurses for clinical concerns.  If you have a medical emergency, go to the nearest emergency room or call 911.  A surgeon from [facility] is always on call at the hospital   [address], [address],   [place], [place]  [address] ?  [address] [address], [place], [place]   [address] [phone] ? [phone] ? FAX [phone] Web site: [email or web address]  

## 2021-03-30 NOTE — H&P (Signed)
Stacie Stone is an 45 y.o. female.   Chief Complaint: hernia HPI: 45 yo female with symptomatic epigastric hernia. She has noticed a bulge for multiple months.  Past Medical History:  Diagnosis Date   Hernia, ventral    Periumbilical mass     Past Surgical History:  Procedure Laterality Date   TUBAL LIGATION     TUBAL LIGATION      Family History  Problem Relation Age of Onset   Hypertension Mother    Hypertension Sister    Social History:  reports that she has been smoking. She has never used smokeless tobacco. She reports current alcohol use. She reports that she does not use drugs.  Allergies: No Known Allergies  Medications Prior to Admission  Medication Sig Dispense Refill   ferrous sulfate 325 (65 FE) MG tablet Take 325 mg by mouth daily with breakfast.     Multiple Vitamins-Minerals (MULTIVITAMIN WITH MINERALS) tablet Take 1 tablet by mouth daily.     norethindrone (MICRONOR) 0.35 MG tablet Take 1 tablet (0.35 mg total) by mouth daily. 28 tablet 1   Omega-3 1000 MG CAPS Take 1,000 mg by mouth daily.     cyclobenzaprine (FLEXERIL) 10 MG tablet Take 1 tablet (10 mg total) by mouth 2 (two) times daily as needed for up to 20 doses for muscle spasms. (Patient not taking: No sig reported) 20 tablet 0   ibuprofen (ADVIL) 600 MG tablet Take 1 tablet (600 mg total) by mouth every 8 (eight) hours as needed for up to 30 doses for moderate pain. (Patient not taking: No sig reported) 30 tablet 0   metroNIDAZOLE (FLAGYL) 500 MG tablet Take 1 tablet (500 mg total) by mouth 2 (two) times daily. (Patient not taking: Reported on 03/10/2021) 14 tablet 0   polyethylene glycol (MIRALAX / GLYCOLAX) 17 g packet Take 17 g by mouth daily. (Patient not taking: Reported on 03/10/2021) 14 each 0    Results for orders placed or performed during the hospital encounter of 03/30/21 (from the past 48 hour(s))  Pregnancy, urine per protocol     Status: None   Collection Time: 03/30/21  9:30 AM  Result  Value Ref Range   Preg Test, Ur NEGATIVE NEGATIVE    Comment:        THE SENSITIVITY OF THIS METHODOLOGY IS >20 mIU/mL. Performed at [facility], [address] [place], [place] [address]    No results found.  Review of Systems  Constitutional:  Negative for chills and fever.  HENT:  Negative for hearing loss.   Respiratory:  Negative for cough.   Cardiovascular:  Negative for chest pain and palpitations.  Gastrointestinal:  Negative for abdominal pain, nausea and vomiting.  Genitourinary:  Negative for dysuria and urgency.  Musculoskeletal:  Negative for myalgias and neck pain.  Skin:  Negative for rash.  Neurological:  Negative for dizziness and headaches.  Hematological:  Does not bruise/bleed easily.  Psychiatric/Behavioral:  Negative for suicidal ideas.    Blood pressure (!) 142/96, pulse (!) 48, temperature 98.1 F (36.7 C), temperature source Oral, resp. rate 16, last menstrual period 03/09/2021, SpO2 100 %. Physical Exam Vitals reviewed.  Constitutional:      Appearance: She is well-developed.  HENT:     Head: Normocephalic and atraumatic.  Eyes:     Conjunctiva/sclera: Conjunctivae normal.     Pupils: Pupils are equal, round, and reactive to light.  Cardiovascular:     Rate and Rhythm: Normal rate and regular  rhythm.  Pulmonary:     Effort: Pulmonary effort is normal.     Breath sounds: Normal breath sounds.  Abdominal:     General: Bowel sounds are normal. There is no distension.     Palpations: Abdomen is soft.     Tenderness: There is no abdominal tenderness.     Comments: Epigastric hernia  Musculoskeletal:        General: Normal range of motion.     Cervical back: Normal range of motion and neck supple.  Skin:    General: Skin is warm and dry.  Neurological:     Mental Status: She is alert and oriented to person, place, and time.  Psychiatric:        Behavior: Behavior normal.     Assessment/Plan 45 yo female with epigastric  hernia -open ventral hernia repair with mesh -planned outpatient procedure -[facility] protocol  Rodman Pickle, MD 03/30/2021, 11:00 AM

## 2021-03-30 NOTE — Anesthesia Postprocedure Evaluation (Signed)
Anesthesia Post Note  Patient: Stacie Stone  Procedure(s) Performed: OPEN PRIMARY VENTRAL HERNIA REPAIR (Abdomen)     Patient location during evaluation: PACU Anesthesia Type: General Level of consciousness: awake and alert and oriented Pain management: pain level controlled Vital Signs Assessment: post-procedure vital signs reviewed and stable Respiratory status: spontaneous breathing, nonlabored ventilation and respiratory function stable Cardiovascular status: blood pressure returned to baseline and stable Postop Assessment: no apparent nausea or vomiting Anesthetic complications: no   No notable events documented.  Last Vitals:  Vitals:   03/30/21 1245 03/30/21 1300  BP: (!) 161/88 (!) 173/93  Pulse: 78 71  Resp: 17 18  Temp:    SpO2: 100% 92%    Last Pain:  Vitals:   03/30/21 1300  TempSrc:   PainSc: Asleep                 Zaron Zwiefelhofer A.

## 2021-03-30 NOTE — Transfer of Care (Signed)
Immediate Anesthesia Transfer of Care Note  Patient: Stacie Stone  Procedure(s) Performed: OPEN PRIMARY VENTRAL HERNIA REPAIR (Abdomen)  Patient Location: PACU  Anesthesia Type:General  Level of Consciousness: awake, alert , oriented and patient cooperative  Airway & Oxygen Therapy: Patient Spontanous Breathing and Patient connected to face mask oxygen  Post-op Assessment: Report given to RN, Post -op Vital signs reviewed and stable and Patient moving all extremities  Post vital signs: Reviewed and stable  Last Vitals:  Vitals Value Taken Time  BP 160/99 03/30/21 1234  Temp    Pulse 85 03/30/21 1236  Resp 14 03/30/21 1236  SpO2 98 % 03/30/21 1236  Vitals shown include unvalidated device data.  Last Pain:  Vitals:   03/30/21 0949  TempSrc: Oral         Complications: No notable events documented.

## 2021-03-30 NOTE — Op Note (Signed)
Op Note  Surgeon: Feliciana Rossetti, MD  Assistant: Lissa Morales, MD (resident)  Procedure: Open ventral hernia repair  Pre-operative diagnosis: Ventral hernia  Postoperative diagnosis: Ventral hernia  Anesthesia: General endotracheal  EBL: minimal  Findings: Small fat containing epigastric ventral hernia, repaired primarily  Indication: 45 y/o F hx prior laparoscopic tubal ligation with a symptomatic epigastric ventral hernia without obstructive symptoms. The patient presents today for operative repair.  Procedure: Informed consent was obtained in the preoperative holding area. The patient was brought to the operating room and placed in the supine position on the operating table. General endotracheal anesthesia was performed. Preoperative antibiotics were administered. A time out was performed where the procedure, patient, operative site, and operating room staff were identified and confirmed.  The patient's epigastric ventral hernia was palpated. We made a 3-4 cm midline vertical skin incision overlying the hernia, and dissected down to the hernia sac with cautery. We dissected down to healthy fascia cirumferentially around the fat containing hernia and identified an approximately 1cm fascial defect. We were unable to reduce the hernia contents back into the abdomen and thus elected to divide the hernia sac with cautery. The fascia around the hernia was then reapproximated with four interrupted 2-0 PDS interrupted sutures. Exparel was injected in the abdominal wall musculature around the hernia and the skin. The incision was then closed in layers with 3-0 vicryl deep dermal stitches and a running 4-0 monocryl subcuticular stitch. Dermabond was applied.  All counts were correct x 2 at the end of the procedure. The patient tolerated the procedure well and was transported to the PACU in stable condition.

## 2021-03-30 NOTE — Anesthesia Procedure Notes (Addendum)
Procedure Name: Intubation Date/Time: 03/30/2021 11:36 AM Performed by: Jari Pigg, CRNA Pre-anesthesia Checklist: Patient identified, Emergency Drugs available, Suction available and Patient being monitored Patient Re-evaluated:Patient Re-evaluated prior to induction Oxygen Delivery Method: Circle system utilized Preoxygenation: Pre-oxygenation with 100% oxygen Induction Type: IV induction Ventilation: Mask ventilation without difficulty Laryngoscope Size: Mac and 4 Grade View: Grade I Tube type: Oral Number of attempts: 1 Airway Equipment and Method: Stylet and Oral airway Placement Confirmation: ETT inserted through vocal cords under direct vision, positive ETCO2 and breath sounds checked- equal and bilateral Secured at: 22 cm Tube secured with: Tape Dental Injury: Teeth and Oropharynx as per pre-operative assessment

## 2021-03-31 ENCOUNTER — Encounter (HOSPITAL_COMMUNITY): Payer: Self-pay | Admitting: General Surgery

## 2021-04-18 ENCOUNTER — Ambulatory Visit (HOSPITAL_BASED_OUTPATIENT_CLINIC_OR_DEPARTMENT_OTHER): Payer: 59 | Attending: Internal Medicine | Admitting: Internal Medicine

## 2021-04-18 ENCOUNTER — Other Ambulatory Visit: Payer: Self-pay

## 2021-04-18 VITALS — Ht 67.0 in | Wt 143.0 lb

## 2021-04-18 DIAGNOSIS — R0683 Snoring: Secondary | ICD-10-CM | POA: Diagnosis present

## 2021-04-18 DIAGNOSIS — G4761 Periodic limb movement disorder: Secondary | ICD-10-CM | POA: Diagnosis not present

## 2021-04-18 DIAGNOSIS — J351 Hypertrophy of tonsils: Secondary | ICD-10-CM | POA: Diagnosis present

## 2021-05-02 NOTE — Procedures (Signed)
   Patient Name: Stacie Stone, Stacie Stone Date: 04/18/2021 Gender: Female D.O.B: 1976-04-05 Age (years): 44 Referring Provider: Newman Pies Height (inches): 67 Interpreting Physician: Jetty Duhamel MD, ABSM Weight (lbs): 143 RPSGT: Lowry Ram BMI: 22 MRN: [id number] Neck Size: 14.00  CLINICAL INFORMATION Sleep Study Type: NPSG Indication for sleep study: Fatigue, Snoring Epworth Sleepiness Score: 9  SLEEP STUDY TECHNIQUE As per the AASM Manual for the Scoring of Sleep and Associated Events v2.3 (April 2016) with a hypopnea requiring 4% desaturations.  The channels recorded and monitored were frontal, central and occipital EEG, electrooculogram (EOG), submentalis EMG (chin), nasal and oral airflow, thoracic and abdominal wall motion, anterior tibialis EMG, snore microphone, electrocardiogram, and pulse oximetry.  MEDICATIONS Medications self-administered by patient taken the night of the study : none reported  SLEEP ARCHITECTURE The study was initiated at 10:25:18 PM and ended at 6:06:36 AM.  Sleep onset time was 31.8 minutes and the sleep efficiency was 69.6%%. The total sleep time was 321 minutes.  Stage REM latency was 61.0 minutes.  The patient spent 7.6%% of the night in stage N1 sleep, 51.1%% in stage N2 sleep, 0.0%% in stage N3 and 41.3% in REM.  Alpha intrusion was absent.  Supine sleep was 0.00%.  RESPIRATORY PARAMETERS The overall apnea/hypopnea index (AHI) was 0.7 per hour. There were 0 total apneas, including 0 obstructive, 0 central and 0 mixed apneas. There were 4 hypopneas and 1 RERAs.  The AHI during Stage REM sleep was 1.8 per hour.  AHI while supine was N/A per hour.  The mean oxygen saturation was 96.8%. The minimum SpO2 during sleep was 91.0%.  soft snoring was noted during this study.  CARDIAC DATA The 2 lead EKG demonstrated sinus rhythm. The mean heart rate was 57.6 beats per minute. Other EKG findings include: PVCs.  LEG MOVEMENT DATA The  total PLMS were 0 with a resulting PLMS index of 0.0. Associated arousal with leg movement index was 13.8 .  IMPRESSIONS - No significant obstructive sleep apnea occurred during this study (AHI = 0.7/h). - The patient had minimal or no oxygen desaturation during the study (Min O2 = 91.0%) - The patient snored with soft snoring volume. - EKG findings include PVCs. - Total Limb Movements 135 (25.2/ hr). 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 -  Consider therapeutic trial of Mirapex, Requip, or Sinemet for treatment of Periodic Leg Movements of Sleep, if clinically appropriate. - Be careful with alcohol, sedatives and other CNS depressants that may worsen sleep apnea and disrupt normal sleep architecture. - Sleep hygiene should be reviewed to assess factors that may improve sleep quality. - Weight management and regular exercise should be initiated or continued if appropriate.  [Electronically signed] 05/02/2021 11:41 AM  Jetty Duhamel MD, ABSM Diplomate, American Board of Sleep Medicine   NPI: [id number]                          Jetty Duhamel Diplomate, American Board of Sleep Medicine  ELECTRONICALLY SIGNED ON:  05/02/2021, 11:37 AM  [facility] PH: [phone]   FX: [phone] ACCREDITED BY THE AMERICAN ACADEMY OF SLEEP MEDICINE

## 2021-06-24 ENCOUNTER — Other Ambulatory Visit: Payer: Self-pay | Admitting: *Deleted

## 2021-06-24 DIAGNOSIS — D509 Iron deficiency anemia, unspecified: Secondary | ICD-10-CM

## 2021-06-25 ENCOUNTER — Other Ambulatory Visit: Payer: 59

## 2021-06-28 ENCOUNTER — Telehealth: Payer: 59 | Admitting: Hematology and Oncology

## 2021-06-28 NOTE — Assessment & Plan Note (Deleted)
02/22/21 showed Hg 8.5, HCT 29.9, platelets 408, iron saturation 4%, ferritin 4  Recommendation: IV Iron: 03/25/21 Gastroenterology referral to make sure there is no GI source of blood loss.

## 2022-01-06 ENCOUNTER — Ambulatory Visit: Payer: Self-pay | Admitting: Family Medicine

## 2022-01-06 ENCOUNTER — Encounter: Payer: Self-pay | Admitting: Family Medicine

## 2022-01-06 ENCOUNTER — Other Ambulatory Visit: Payer: Self-pay

## 2022-01-06 DIAGNOSIS — B9689 Other specified bacterial agents as the cause of diseases classified elsewhere: Secondary | ICD-10-CM

## 2022-01-06 DIAGNOSIS — Z113 Encounter for screening for infections with a predominantly sexual mode of transmission: Secondary | ICD-10-CM

## 2022-01-06 DIAGNOSIS — N760 Acute vaginitis: Secondary | ICD-10-CM

## 2022-01-06 LAB — WET PREP FOR TRICH, YEAST, CLUE
Trichomonas Exam: NEGATIVE
Yeast Exam: NEGATIVE

## 2022-01-06 MED ORDER — METRONIDAZOLE 500 MG PO TABS: 500.0000 mg | ORAL_TABLET | Freq: Two times a day (BID) | ORAL | 0 refills | Status: AC

## 2022-01-06 NOTE — Progress Notes (Signed)
[facility] Department ? ?STI clinic/screening visit ?[place] [address] ?[phone] ? ?Subjective:  ?Stacie Stone is a 46 y.o. female being seen today for an STI screening visit. The patient reports they do not have symptoms.  Patient reports that they do not desire a pregnancy in the next year.   They reported they are not interested in discussing contraception today.   ? ?Patient's last menstrual period was 01/02/2022 (exact date). ? ? ?Patient has the following medical conditions:   ?Patient Active Problem List  ? Diagnosis Date Noted  ? Snoring 04/18/2021  ? Iron deficiency anemia 03/01/2021  ? Tonsillitis 01/15/2013  ? Hypokalemia 01/15/2013  ? ? ?Chief Complaint  ?Patient presents with  ? SEXUALLY TRANSMITTED DISEASE  ?  Screening  ? ? ?HPI ? ?Patient reports here for screening, ex partner was unfaithful and wants to be sure there are no infections.  ? ?Last HIV test per patient/review of record was 05/04/2020 ?Patient reports last pap was 02/22/2021.  ? ?Screening for MPX risk: ?Does the patient have an unexplained rash? No ?Is the patient MSM? No ?Does the patient endorse multiple sex partners or anonymous sex partners? No ?Did the patient have close or sexual contact with a person diagnosed with MPX? No ?Has the patient traveled outside the [place] where MPX is endemic? No ?Is there a high clinical suspicion for MPX-- evidenced by one of the following No ? -Unlikely to be chickenpox ? -Lymphadenopathy ? -Rash that present in same phase of evolution on any given body part ?See flowsheet for further details and programmatic requirements.  ? ? ?The following portions of the patient's history were reviewed and updated as appropriate: allergies, current medications, past medical history, past social history, past surgical history and problem list. ? ?Objective:  ?There were no vitals filed for this visit. ? ?Physical Exam ?Vitals and nursing note reviewed.  ?Constitutional:    ?   Appearance: Normal appearance.  ?HENT:  ?   Head: Normocephalic and atraumatic.  ?   Mouth/Throat:  ?   Mouth: Mucous membranes are moist.  ?   Pharynx: Oropharynx is clear. No oropharyngeal exudate or posterior oropharyngeal erythema.  ?Pulmonary:  ?   Effort: Pulmonary effort is normal.  ?Abdominal:  ?   General: Abdomen is flat.  ?   Palpations: There is no mass.  ?   Tenderness: There is no abdominal tenderness. There is no rebound.  ?Genitourinary: ?   Exam position: Lithotomy position.  ?   Pubic Area: No rash or pubic lice.   ?   Labia:     ?   Right: No rash or lesion.     ?   Left: No rash or lesion.   ?   Vagina: Normal. No vaginal discharge, erythema, bleeding or lesions.  ?   Cervix: No cervical motion tenderness, discharge, friability, lesion or erythema.  ?   Uterus: Normal.   ?   Adnexa: Right adnexa normal and left adnexa normal.  ?   Comments: Deferred - pt self collected  ?Lymphadenopathy:  ?   Head:  ?   Right side of head: No preauricular or posterior auricular adenopathy.  ?   Left side of head: No preauricular or posterior auricular adenopathy.  ?   Cervical: No cervical adenopathy.  ?   Upper Body:  ?   Right upper body: No supraclavicular or axillary adenopathy.  ?   Left upper body: No supraclavicular or  axillary adenopathy.  ?   Lower Body: No right inguinal adenopathy. No left inguinal adenopathy.  ?Skin: ?   General: Skin is warm and dry.  ?   Findings: No rash.  ?Neurological:  ?   General: No focal deficit present.  ?   Mental Status: She is alert and oriented to person, place, and time.  ?Psychiatric:     ?   Mood and Affect: Mood normal.     ?   Behavior: Behavior normal.  ? ? ? ?Assessment and Plan:  ?Stacie Stone is a 46 y.o. female presenting to the [facility] Department for STI screening ? ?1. Screening examination for venereal disease ?Patient accepted all screenings including wet prep, vaginal CT/GC and declines bloodwork for HIV/RPR.  ?Patient meets criteria  for HepB screening? Yes. Ordered? No - declined  ?Patient meets criteria for HepC screening? Yes. Ordered? No - declines  ? ?Wet prep results +amin, + clue    ?Treatment needed  ?Discussed time line for [facility] results and that patient will be called with positive results and encouraged patient to call if she had not heard in 2 weeks.  ?Counseled to return or seek care for continued or worsening symptoms ?Recommended condom use with all sex ? ?Patient is currently using  bilateral tubal ligation  to prevent pregnancy.   ?- Chlamydia/Gonorrhea [facility] ?- WET PREP FOR [place], YEAST, CLUE ? ?2. BV (bacterial vaginosis) ? ?- metroNIDAZOLE (FLAGYL) 500 MG tablet; Take 1 tablet (500 mg total) by mouth 2 (two) times daily for 7 days.  Dispense: 14 tablet; Refill: 0 ? ? ?Return for as needed. ? ?Future Appointments  ?Date Time Provider [place]  ?02/24/2022  3:00 PM Tamela Gammon, NP [facility] None  ? ? ?Junious Dresser, FNP ?

## 2022-01-24 ENCOUNTER — Other Ambulatory Visit: Payer: Self-pay

## 2022-01-24 ENCOUNTER — Emergency Department
Admission: EM | Admit: 2022-01-24 | Discharge: 2022-01-24 | Disposition: A | Discharge status: Home / Self Care | Payer: 59 | Attending: Emergency Medicine | Admitting: Emergency Medicine

## 2022-01-24 ENCOUNTER — Emergency Department: Payer: 59

## 2022-01-24 DIAGNOSIS — R519 Headache, unspecified: Secondary | ICD-10-CM | POA: Diagnosis present

## 2022-01-24 DIAGNOSIS — G43001 Migraine without aura, not intractable, with status migrainosus: Secondary | ICD-10-CM | POA: Insufficient documentation

## 2022-01-24 DIAGNOSIS — R030 Elevated blood-pressure reading, without diagnosis of hypertension: Secondary | ICD-10-CM | POA: Insufficient documentation

## 2022-01-24 LAB — URINALYSIS, ROUTINE W REFLEX MICROSCOPIC
Bacteria, UA: NONE SEEN
Bilirubin Urine: NEGATIVE
Glucose, UA: NEGATIVE mg/dL
Ketones, ur: NEGATIVE mg/dL
Leukocytes,Ua: NEGATIVE
Nitrite: NEGATIVE
Protein, ur: 30 mg/dL — AB
RBC / HPF: >50 RBC/hpf — ABNORMAL HIGH (ref 0–5)
Specific Gravity, Urine: 1.019 (ref 1.005–1.030)
pH: 5 (ref 5.0–8.0)

## 2022-01-24 LAB — CBG MONITORING, ED: Glucose-Capillary: 88 mg/dL (ref 70–99)

## 2022-01-24 MED ORDER — SODIUM CHLORIDE 0.9 % IV BOLUS
1000.0000 mL | Freq: Once | INTRAVENOUS | Status: AC
  Administered 2022-01-24: 1000 mL via INTRAVENOUS

## 2022-01-24 MED ORDER — KETOROLAC TROMETHAMINE 30 MG/ML IJ SOLN
30.0000 mg | Freq: Once | INTRAMUSCULAR | Status: AC
  Administered 2022-01-24: 30 mg via INTRAVENOUS
  Filled 2022-01-24: qty 1

## 2022-01-24 MED ORDER — HYDROCHLOROTHIAZIDE 25 MG PO TABS: 25.0000 mg | ORAL_TABLET | Freq: Every day | ORAL | 1 refills | Status: AC

## 2022-01-24 MED ORDER — ONDANSETRON HCL 4 MG/2ML IJ SOLN
4.0000 mg | Freq: Once | INTRAMUSCULAR | Status: AC
  Administered 2022-01-24: 4 mg via INTRAVENOUS
  Filled 2022-01-24: qty 2

## 2022-01-24 NOTE — ED Provider Notes (Signed)
? ?[facility] ?Provider Note ? ? ? Event Date/Time  ? First MD Initiated Contact with Patient 01/24/22 1226   ?  (approximate) ? ? ?History  ? ?Headache ? ? ?HPI ? ?Stacie Stone is a 46 y.o. female presents emergency department complaining of headache, elevated blood pressure, photophobia, x1 month.  Patient's been taking over-the-counter medications without relief.  States the headache is located around the front and wraps around to the back of her head.  States when she leans her head back it hurts even more.  No numbness or tingling.  No vomiting.  No history of hypertension.  Does have family history of hypertension. ? ?  ? ? ?Physical Exam  ? ?Triage Vital Signs: ?ED Triage Vitals  ?Enc Vitals Group  ?   BP 01/24/22 1217 (!) 170/99  ?   Pulse Rate 01/24/22 1217 61  ?   Resp 01/24/22 1217 18  ?   Temp 01/24/22 1217 99 ?F (37.2 ?C)  ?   Temp Source 01/24/22 1217 Oral  ?   SpO2 01/24/22 1217 99 %  ?   Weight 01/24/22 1226 143 lb 1.3 oz (64.9 kg)  ?   Height 01/24/22 1226 5\' 7"  (1.702 m)  ?   Head Circumference --   ?   Peak Flow --   ?   Pain Score 01/24/22 1209 10  ?   Pain Loc --   ?   Pain Edu? --   ?   Excl. in GC? --   ? ? ?Most recent vital signs: ?Vitals:  ? 01/24/22 1217  ?BP: (!) 170/99  ?Pulse: 61  ?Resp: 18  ?Temp: 99 ?F (37.2 ?C)  ?SpO2: 99%  ? ? ? ?General: Awake, no distress.   ?CV:  Good peripheral perfusion. regular rate and  rhythm ?Resp:  Normal effort.  ?Abd:  No distention.   ?Other:  Cranial nerves II through XII grossly intact ? ? ?ED Results / Procedures / Treatments  ? ?Labs ?(all labs ordered are listed, but only abnormal results are displayed) ?Labs Reviewed  ?URINALYSIS, ROUTINE W REFLEX MICROSCOPIC - Abnormal; Notable for the following components:  ?    Result Value  ? Color, Urine YELLOW (*)   ? APPearance HAZY (*)   ? Hgb urine dipstick LARGE (*)   ? Protein, ur 30 (*)   ? RBC / HPF >50 (*)   ? All other components within normal limits  ?CBG MONITORING, ED   ? ? ? ?EKG ? ? ? ? ?RADIOLOGY ?CT of the head ? ? ? ?PROCEDURES: ? ? ?Procedures ? ? ?MEDICATIONS ORDERED IN ED: ?Medications  ?sodium chloride 0.9 % bolus 1,000 mL (1,000 mLs Intravenous New Bag/Given 01/24/22 1537)  ?ketorolac (TORADOL) 30 MG/ML injection 30 mg (30 mg Intravenous Given 01/24/22 1537)  ?ondansetron (ZOFRAN) injection 4 mg (4 mg Intravenous Given 01/24/22 1537)  ? ? ? ?IMPRESSION / MDM / ASSESSMENT AND PLAN / ED COURSE  ?I reviewed the triage vital signs and the nursing notes. ?             ?               ? ?Differential diagnosis includes, but is not limited to, migraine, SAH, CVA, subdural, mass ? ?CT of the head ordered, urinalysis ordered as patient had complained of urinary frequency. ?CBG to evaluate the patient's glucose due to urinary frequency ? ?CBC is normal, ? ?CT of the head was independently reviewed  by me.  Not see any acute abnormality.  Awaiting radiology read.  CT of the head was read as normal by radiology ? ?Patient continues to have a headache so we will do migraine cocktail of normal saline 1 L IV, Toradol 30 mg IV and Zofran.  Patient is driving tonight we will give her Benadryl. ? ?We will start patient on blood pressure medication.  She states she has been to several clinics where they have not given her medication and her blood pressures remain elevated.  We will start her on HCTZ 25 mg daily.  She is to follow-up with her regular physician.  Return if worsening. ? ? ? ?  ? ? ?FINAL CLINICAL IMPRESSION(S) / ED DIAGNOSES  ? ?Final diagnoses:  ?Migraine without aura and with status migrainosus, not intractable  ?Elevated blood pressure reading  ? ? ? ?Rx / DC Orders  ? ?ED Discharge Orders   ? ?      Ordered  ?  hydrochlorothiazide (HYDRODIURIL) 25 MG tablet  Daily       ? 01/24/22 1446  ? ?  ?  ? ?  ? ? ? ?Note:  This document was prepared using Dragon voice recognition software and may include unintentional dictation errors. ? ?  ?Faythe Ghee, PA-C ?01/24/22 1552 ? ?   ?Jene Every, MD ?01/25/22 1604 ? ?

## 2022-01-24 NOTE — Discharge Instructions (Signed)
Follow-up with a PCP for your blood pressure.  Take the medication as prescribed.  You will want to take this at the beginning of your day as it will cause you to urinate more frequently.  Return the emergency department for worsening ?

## 2022-01-24 NOTE — ED Triage Notes (Signed)
Pt c/o intermittent HA over the past month with photophobia, states she has been taking OTC meds without relief. ?

## 2022-02-13 ENCOUNTER — Encounter: Payer: Self-pay | Admitting: Emergency Medicine

## 2022-02-13 ENCOUNTER — Other Ambulatory Visit: Payer: Self-pay

## 2022-02-13 ENCOUNTER — Emergency Department
Admission: EM | Admit: 2022-02-13 | Discharge: 2022-02-13 | Disposition: A | Discharge status: Home / Self Care | Payer: 59 | Attending: Student in an Organized Health Care Education/Training Program | Admitting: Student in an Organized Health Care Education/Training Program

## 2022-02-13 DIAGNOSIS — M545 Low back pain, unspecified: Secondary | ICD-10-CM | POA: Diagnosis present

## 2022-02-13 DIAGNOSIS — M5416 Radiculopathy, lumbar region: Secondary | ICD-10-CM | POA: Insufficient documentation

## 2022-02-13 MED ORDER — ORPHENADRINE CITRATE 30 MG/ML IJ SOLN
60.0000 mg | Freq: Once | INTRAMUSCULAR | Status: AC
  Administered 2022-02-13: 60 mg via INTRAMUSCULAR
  Filled 2022-02-13: qty 2

## 2022-02-13 MED ORDER — PREDNISONE 20 MG PO TABS
60.0000 mg | ORAL_TABLET | Freq: Once | ORAL | Status: AC
  Administered 2022-02-13: 60 mg via ORAL
  Filled 2022-02-13: qty 3

## 2022-02-13 MED ORDER — HYDROCODONE-ACETAMINOPHEN 5-325 MG PO TABS: 1.0000 | ORAL_TABLET | Freq: Four times a day (QID) | ORAL | 0 refills | Status: AC | PRN

## 2022-02-13 MED ORDER — KETOROLAC TROMETHAMINE 60 MG/2ML IM SOLN
30.0000 mg | Freq: Once | INTRAMUSCULAR | Status: AC
  Administered 2022-02-13: 30 mg via INTRAMUSCULAR
  Filled 2022-02-13: qty 2

## 2022-02-13 MED ORDER — METHOCARBAMOL 750 MG PO TABS: 750.0000 mg | ORAL_TABLET | Freq: Four times a day (QID) | ORAL | 0 refills | Status: AC | PRN

## 2022-02-13 MED ORDER — PREDNISONE 10 MG (21) PO TBPK: ORAL_TABLET | ORAL | 0 refills | Status: AC

## 2022-02-13 NOTE — ED Triage Notes (Signed)
Pt here with back that started yesterday. Pt denies fall or injury. Pt states pain is lower and radiates down both legs. ?

## 2022-02-13 NOTE — ED Provider Notes (Signed)
? ?[facility] ?Provider Note ? ? ? Event Date/Time  ? First MD Initiated Contact with Patient 02/13/22 [phone]   ?  (approximate) ? ? ?History  ? ?Back Pain ? ? ?HPI ? ?Stacie Stone is a 46 y.o. female with no significant past medical history and as listed in EMR presents to the emergency department for treatment and evaluation of back pain.  Patient states that she had picked up a bag and turned and felt pain in her lower back yesterday.  Pain radiates down both legs.  She now also has pain behind the left shoulder blade.  Symptoms are worse with standing and movement but also get worse if she sits down for long periods of time.  No alleviating measures attempted prior to arrival.  She has had similar issues in the past that resolved without intervention.. ? ?  ? ? ?Physical Exam  ? ?Triage Vital Signs: ?ED Triage Vitals  ?Enc Vitals Group  ?   BP 02/13/22 0723 109/63  ?   Pulse Rate 02/13/22 0723 87  ?   Resp 02/13/22 0723 16  ?   Temp 02/13/22 0722 97.7 ?F (36.5 ?C)  ?   Temp Source 02/13/22 0722 Oral  ?   SpO2 02/13/22 0723 99 %  ?   Weight 02/13/22 0722 153 lb (69.4 kg)  ?   Height 02/13/22 0722 5\' 7"  (1.702 m)  ?   Head Circumference --   ?   Peak Flow --   ?   Pain Score 02/13/22 0722 10  ?   Pain Loc --   ?   Pain Edu? --   ?   Excl. in GC? --   ? ? ?Most recent vital signs: ?Vitals:  ? 02/13/22 0722 02/13/22 0723  ?BP:  109/63  ?Pulse:  87  ?Resp:  16  ?Temp: 97.7 ?F (36.5 ?C)   ?SpO2:  99%  ? ? ?General: Awake, no distress.  ?CV:  Good peripheral perfusion.  ?Resp:  Normal effort.  ?Abd:  No distention.  ?Other:  No focal tenderness over the thoracic or lumbar spine.  Motor and sensory function is intact.  She has 5 out of 5 strength of the upper and lower extremities. ? ? ?ED Results / Procedures / Treatments  ? ?Labs ?(all labs ordered are listed, but only abnormal results are displayed) ?Labs Reviewed - No data to display ? ? ?EKG ? ?Not indicated. ? ? ?RADIOLOGY ? ?Image and  radiology report reviewed by me. ? ?Not indicated. ? ?PROCEDURES: ? ?Critical Care performed: No ? ?Procedures ? ? ?MEDICATIONS ORDERED IN ED: ?Medications  ?ketorolac (TORADOL) injection 30 mg (30 mg Intramuscular Given 02/13/22 0816)  ?orphenadrine (NORFLEX) injection 60 mg (60 mg Intramuscular Given 02/13/22 0815)  ?predniSONE (DELTASONE) tablet 60 mg (60 mg Oral Given 02/13/22 0815)  ? ? ? ?IMPRESSION / MDM / ASSESSMENT AND PLAN / ED COURSE  ? ?I have reviewed the triage note. ? ?Differential diagnosis includes, but is not limited to: Lumbosacral strain, sciatica, disc herniation, degenerative disc disease ? ?46 year old female presenting to the emergency department with 1 day after onset of back pain after moving a bag.  Patient states that the bag was not particularly heavy.  She had similar symptoms about a month ago that went away after few hours but this time pain has been persistent. ? ?IM Toradol and Norflex to be given as well as p.o. prednisone.  Symptoms and exam are most consistent with  lumbar radiculopathy. ? ?Some relief after toradol, norflex, and prednisone. Offered additional pain medications here, however she is driving and doesn't have anyone that can drive her home once discharged. Plan will be to send Rx to her pharmacy and let her pick them up on the way home. She is agreeable to this plan. Follow up and ER return precautions discussed. ?  ? ? ?FINAL CLINICAL IMPRESSION(S) / ED DIAGNOSES  ? ?Final diagnoses:  ?Lumbar radiculopathy, acute  ? ? ? ?Rx / DC Orders  ? ?ED Discharge Orders   ? ?      Ordered  ?  HYDROcodone-acetaminophen (NORCO/VICODIN) 5-325 MG tablet  Every 6 hours PRN       ? 02/13/22 0914  ?  predniSONE (STERAPRED UNI-PAK 21 TAB) 10 MG (21) TBPK tablet       ? 02/13/22 0914  ?  methocarbamol (ROBAXIN-750) 750 MG tablet  Every 6 hours PRN       ? 02/13/22 0914  ? ?  ?  ? ?  ? ? ? ?Note:  This document was prepared using Dragon voice recognition software and may include  unintentional dictation errors. ?  Chinita Pester, FNP ?02/13/22 0920 ? ?  ?Willy Eddy, MD ?02/13/22 1057 ? ?

## 2022-02-13 NOTE — ED Notes (Signed)
See triage note  presents with lower back pain  states she was moving some things in storage  turned and felt a pop  states pain is moving into both legs denies taking any PO meds but did use heating pain  increased pain with standing ?

## 2022-02-24 ENCOUNTER — Ambulatory Visit: Payer: 59 | Admitting: Nurse Practitioner

## 2022-02-24 DIAGNOSIS — Z0289 Encounter for other administrative examinations: Secondary | ICD-10-CM

## 2022-02-24 NOTE — Progress Notes (Deleted)
   Stacie Stone 12/24/1975 [id number]   History:  46 y.o. G2P2002 presents for annual exam. Monthly cycles. 2022 LGSIL, normal pap history prior. Previous provider saw cervical polyp versus fibroid and felt enlarged uterus. Ultrasound recommended but no follow up done. Anemia managed by hematology, HTN managed by PCP.   Gynecologic History No LMP recorded.   Contraception/Family planning: tubal ligation Sexually active: Yes  Health Maintenance Last Pap: 02/22/2021. Results were: LGSIL neg HR HPV Last mammogram: Never Last colonoscopy: Never Last Dexa: Not indicated  Past medical history, past surgical history, family history and social history were all reviewed and documented in the EPIC chart.  ROS:  A ROS was performed and pertinent positives and negatives are included.  Exam:  There were no vitals filed for this visit. There is no height or weight on file to calculate BMI.  General appearance:  Normal Thyroid:  Symmetrical, normal in size, without palpable masses or nodularity. Respiratory  Auscultation:  Clear without wheezing or rhonchi Cardiovascular  Auscultation:  Regular rate, without rubs, murmurs or gallops  Edema/varicosities:  Not grossly evident Abdominal  Soft,nontender, without masses, guarding or rebound.  Liver/spleen:  No organomegaly noted  Hernia:  None appreciated  Skin  Inspection:  Grossly normal Breasts: Examined lying and sitting.   Right: Without masses, retractions, nipple discharge or axillary adenopathy.   Left: Without masses, retractions, nipple discharge or axillary adenopathy. Genitourinary   Inguinal/mons:  Normal without inguinal adenopathy  External genitalia:  Normal appearing vulva with no masses, tenderness, or lesions  BUS/Urethra/Skene's glands:  Normal  Vagina:  Normal appearing with normal color and discharge, no lesions  Cervix:  Normal appearing without discharge or lesions  Uterus:  Normal in size, shape and contour.   Midline and mobile, nontender  Adnexa/parametria:     Rt: Normal in size, without masses or tenderness.   Lt: Normal in size, without masses or tenderness.  Anus and perineum: Normal  Digital rectal exam: Normal sphincter tone without palpated masses or tenderness  Patient informed chaperone available to be present for breast and pelvic exam. Patient has requested no chaperone to be present. Patient has been advised what will be completed during breast and pelvic exam.   Assessment/Plan:  46 y.o. G2P2002 for annual exam.    Return in 1 year for annual.   Tamela Gammon DNP, 9:53 AM 02/24/2022

## 2022-04-01 ENCOUNTER — Other Ambulatory Visit: Payer: Self-pay | Admitting: Nurse Practitioner

## 2022-04-01 DIAGNOSIS — Z1231 Encounter for screening mammogram for malignant neoplasm of breast: Secondary | ICD-10-CM

## 2022-08-15 ENCOUNTER — Encounter: Payer: Self-pay | Admitting: Hematology and Oncology

## 2022-08-16 ENCOUNTER — Encounter: Payer: Self-pay | Admitting: Hematology and Oncology

## 2022-10-19 ENCOUNTER — Encounter: Payer: Self-pay | Admitting: Nurse Practitioner

## 2022-10-19 ENCOUNTER — Encounter: Payer: Self-pay | Admitting: Hematology and Oncology

## 2022-11-01 ENCOUNTER — Ambulatory Visit: Payer: 59 | Admitting: Nurse Practitioner

## 2022-12-19 ENCOUNTER — Other Ambulatory Visit: Payer: Self-pay | Admitting: Nurse Practitioner

## 2023-03-23 ENCOUNTER — Encounter: Payer: Self-pay | Admitting: Hematology and Oncology

## 2023-07-13 IMAGING — CT CT HEAD W/O CM
4 series · 17 of 47 positions shown, 19 images · non-contrast
Comparison: 08/30/2015

CLINICAL DATA: Intermittent headache, photophobia



[Series 2: head wo · axial · 0.43mm/px · z∈[-53,+67]mm · 7 of 34 slices shown, 9 images]
[im 5/34  brain]
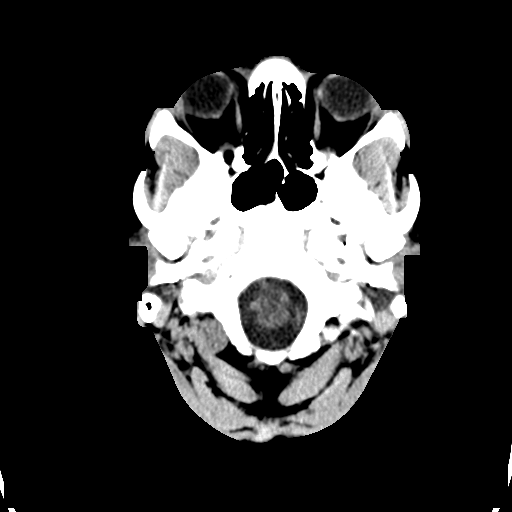
[im 5/34  bone]
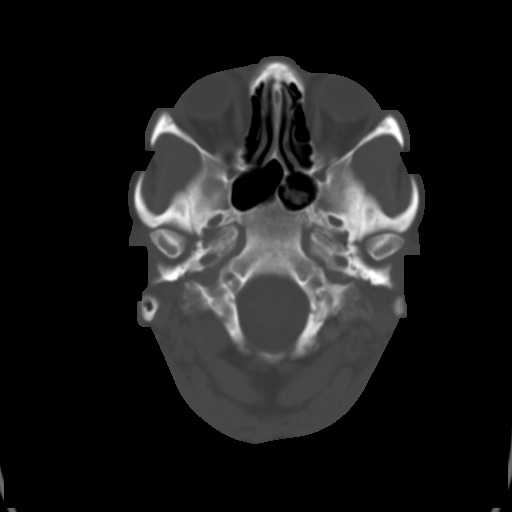
[im 9/34  brain]
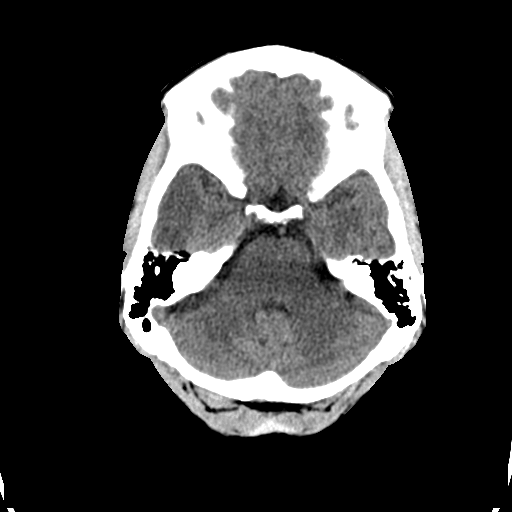
[im 13/34  brain]
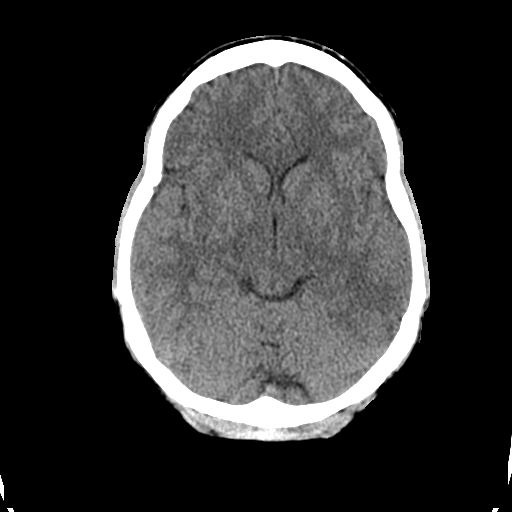
[im 17/34  brain]
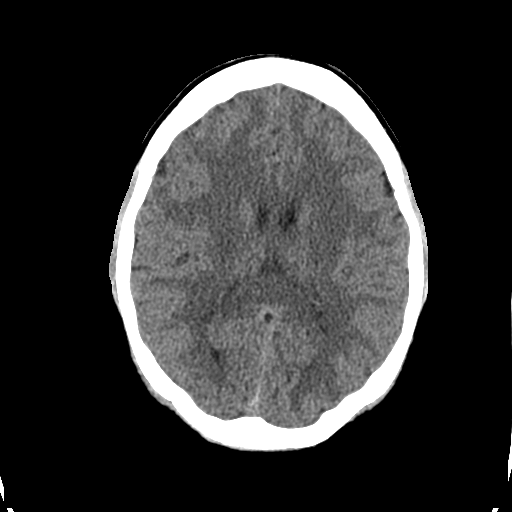
[im 21/34  brain]
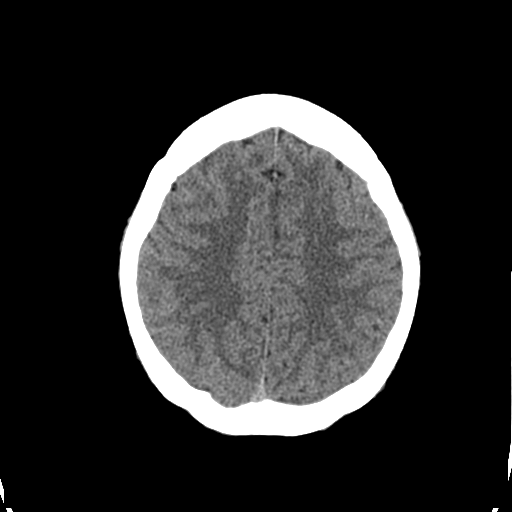
[im 21/34  bone]
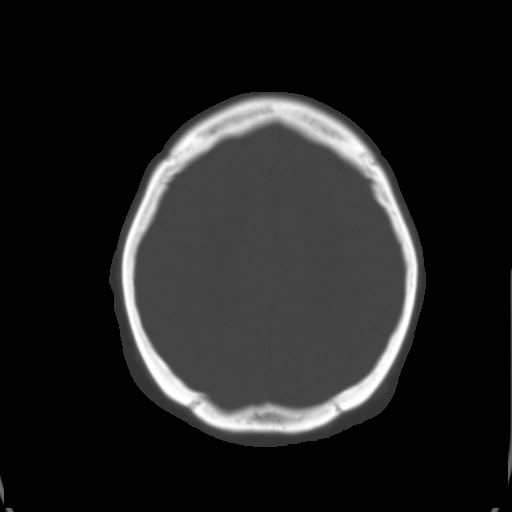
[im 25/34  brain]
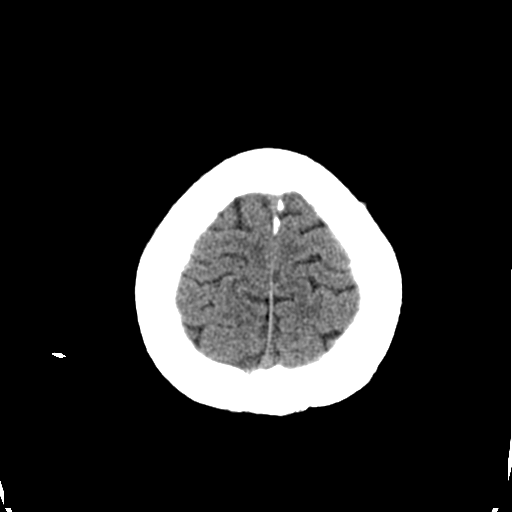
[im 29/34  brain]
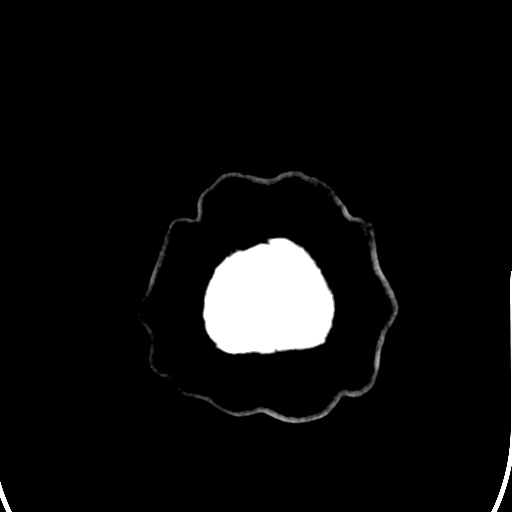

[Series 3: head bone · axial · 0.43mm/px · z∈[-57,-1]mm · 4 of 83 slices shown]
[im 9/83  bone]
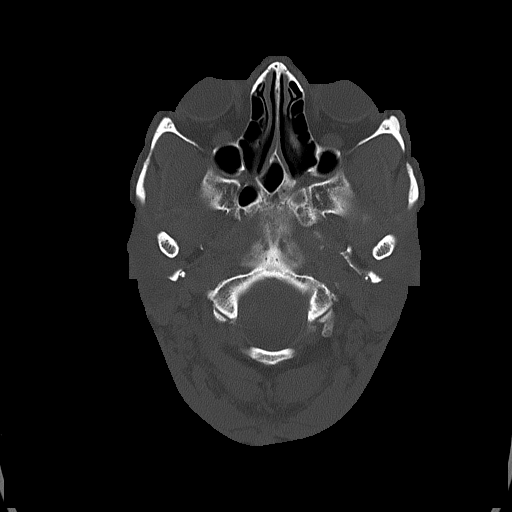
[im 17/83  bone]
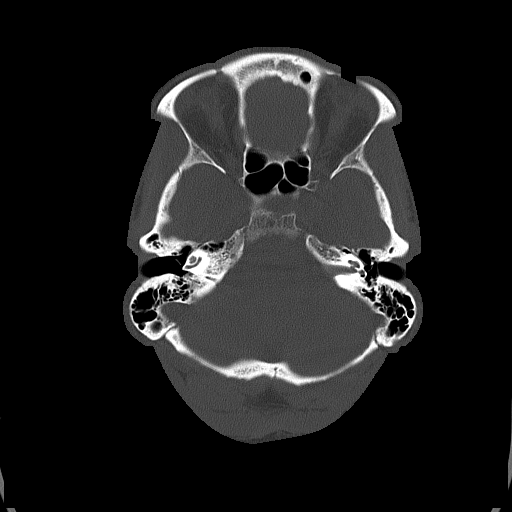
[im 25/83  bone]
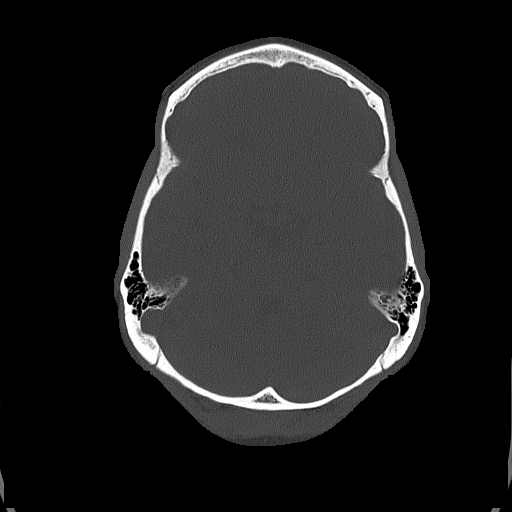
[im 37/83  bone]
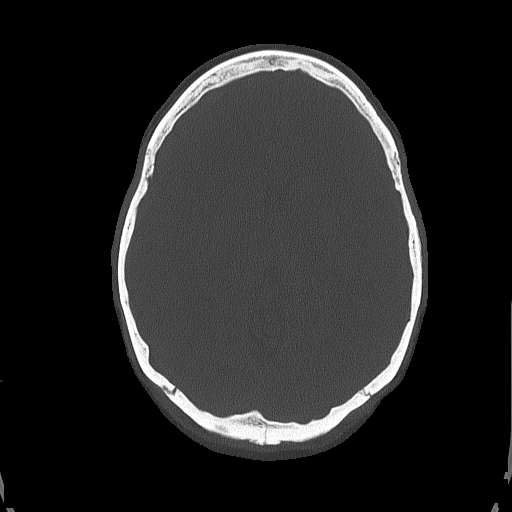

[Series 4: coronal soft tissue · coronal · 0.36mm/px · 3 of 72 slices shown]
[im 24/72  brain]
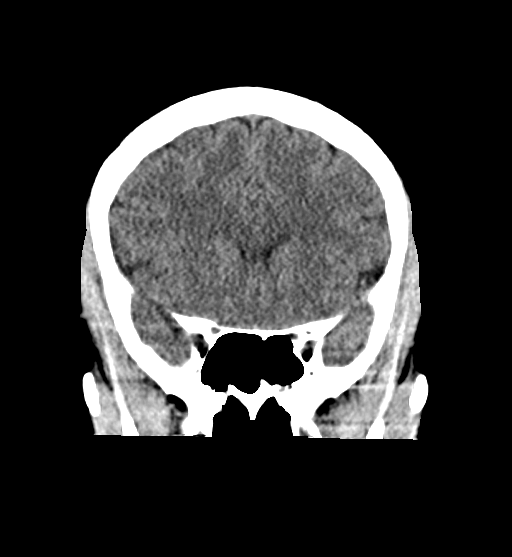
[im 32/72  brain]
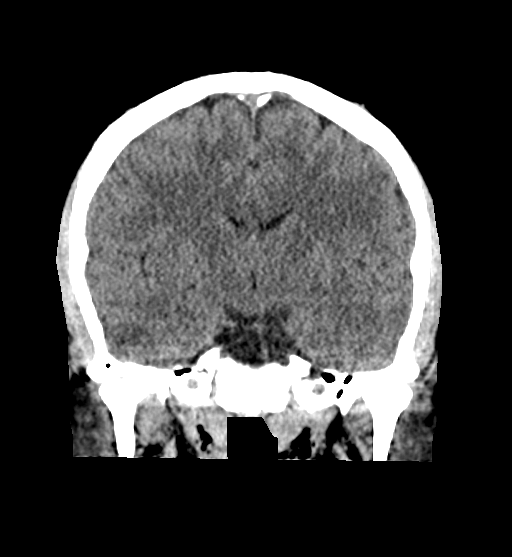
[im 40/72  brain]
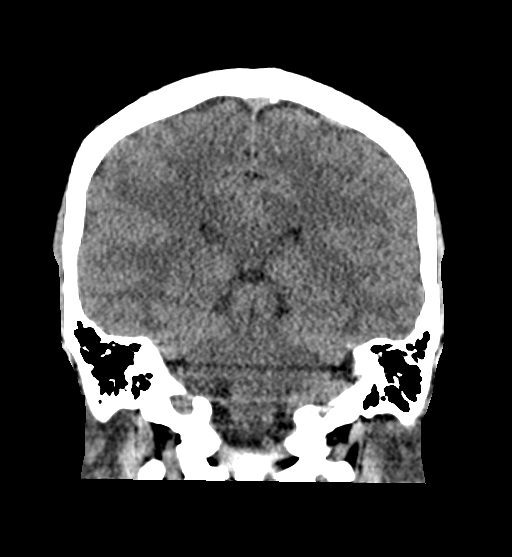

[Series 5: sagittal soft tissue · sagittal · 0.38mm/px · 3 of 56 slices shown]
[im 19/56  brain]
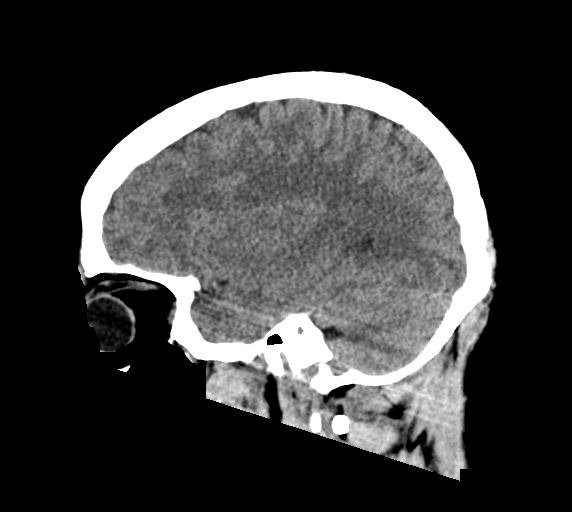
[im 28/56  brain]
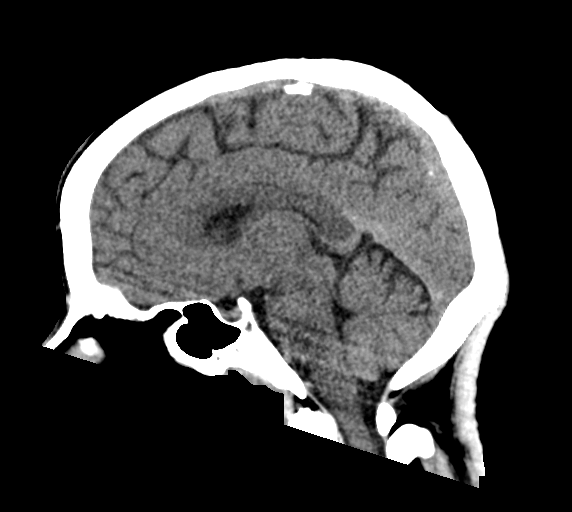
[im 37/56  brain]
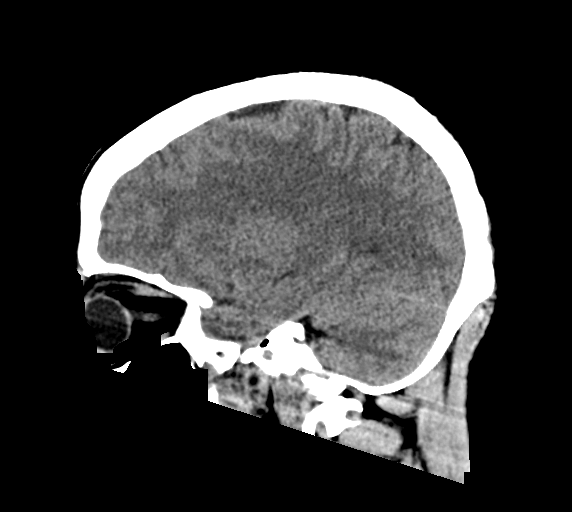

[17 of 47 positions shown; findings below may reference images not displayed]

FINDINGS: Brain: No evidence of acute infarction, hemorrhage, hydrocephalus,
extra-axial collection or mass lesion/mass effect.

Vascular: No hyperdense vessel or unexpected calcification.

Skull: Normal. Negative for fracture or focal lesion.

Sinuses/Orbits: No acute finding.

Other: None.
IMPRESSION: Normal head CT without contrast for age
